# Patient Record
Sex: Female | Born: 1990 | Race: Black or African American | Hispanic: No | Marital: Married | Smoking: Former smoker
Health system: Southern US, Community
[De-identification: ages and names within clinical notes are randomized; demographics above are authoritative.]

## PROBLEM LIST (undated history)

## (undated) ENCOUNTER — Inpatient Hospital Stay (HOSPITAL_COMMUNITY): Payer: Medicaid Other

## (undated) ENCOUNTER — Inpatient Hospital Stay (HOSPITAL_COMMUNITY): Payer: Self-pay

## (undated) DIAGNOSIS — R748 Abnormal levels of other serum enzymes: Secondary | ICD-10-CM

## (undated) DIAGNOSIS — K219 Gastro-esophageal reflux disease without esophagitis: Secondary | ICD-10-CM

## (undated) HISTORY — PX: INDUCED ABORTION: SHX677

## (undated) HISTORY — PX: NO PAST SURGERIES: SHX2092

---

## 2014-02-20 ENCOUNTER — Encounter (HOSPITAL_COMMUNITY): Payer: Self-pay | Admitting: Emergency Medicine

## 2014-02-20 ENCOUNTER — Emergency Department (HOSPITAL_COMMUNITY)
Admission: EM | Admit: 2014-02-20 | Discharge: 2014-02-20 | Disposition: A | Discharge status: Home / Self Care | Payer: BC Managed Care – PPO | Source: Home / Self Care | Attending: Family Medicine | Admitting: Family Medicine

## 2014-02-20 DIAGNOSIS — Z3201 Encounter for pregnancy test, result positive: Secondary | ICD-10-CM

## 2014-02-20 DIAGNOSIS — Z3490 Encounter for supervision of normal pregnancy, unspecified, unspecified trimester: Secondary | ICD-10-CM

## 2014-02-20 LAB — POCT PREGNANCY, URINE: Preg Test, Ur: POSITIVE — AB

## 2014-02-20 NOTE — ED Provider Notes (Signed)
CSN: [id number]     Arrival date & time 02/20/14  1921 History   First MD Initiated Contact with Patient 02/20/14 2031     Chief Complaint  Patient presents with  . Possible Pregnancy   (Consider location/radiation/quality/duration/timing/severity/associated sxs/prior Treatment) HPI Comments: States she took a home pregnancy test yesterday that was positive and she come here for confirmation so that she may [facility].  G2P1 LNMP: 01-23-2014 No abdominal pain, vaginal bleeding or vaginal discharge  Patient is a 23 y.o. female presenting with pregnancy problem. The history is provided by the patient.  Possible Pregnancy    History reviewed. No pertinent past medical history. No past surgical history on file. No family history on file. History  Substance Use Topics  . Smoking status: Not on file  . Smokeless tobacco: Not on file  . Alcohol Use: Not on file   OB History   Grav Para Term Preterm Abortions TAB SAB Ect Mult Living                 Review of Systems  Constitutional: Negative.   HENT: Negative.   Eyes: Negative.   Respiratory: Negative.   Cardiovascular: Negative.   Gastrointestinal: Negative.   Endocrine: Negative for polydipsia, polyphagia and polyuria.  Genitourinary: Negative.   Musculoskeletal: Negative.   Skin: Negative.     Allergies  Review of patient's allergies indicates no known allergies.  Home Medications   Prior to Admission medications   Not on File   BP 103/60  Pulse 77  Temp(Src) 98.8 F (37.1 C) (Oral)  Resp 16  SpO2 96% Physical Exam  Nursing note and vitals reviewed. Constitutional: She is oriented to person, place, and time. She appears well-developed and well-nourished. No distress.  HENT:  Head: Normocephalic and atraumatic.  Eyes: Conjunctivae are normal.  Cardiovascular: Normal rate, regular rhythm and normal heart sounds.   Pulmonary/Chest: Effort normal and breath sounds normal.   Abdominal: Soft. Bowel sounds are normal. She exhibits no distension. There is no tenderness.  Musculoskeletal: Normal range of motion.  Neurological: She is alert and oriented to person, place, and time.  Skin: Skin is warm and dry.  Psychiatric: She has a normal mood and affect. Her behavior is normal.    ED Course  Procedures (including critical care time) Labs Review Labs Reviewed  POCT PREGNANCY, URINE - Abnormal; Notable for the following:    Preg Test, Ur POSITIVE (*)    All other components within normal limits    Imaging Review No results found.   MDM   1. Pregnancy    Advised to begin daily prenatal vitamins and [facility].    Jess BartersJennifer Lee PalmerPresson, [place]PA 02/20/14 2052

## 2014-02-20 NOTE — ED Notes (Addendum)
Here for pregnancy test to confirm for doctors appt  States she took a home test which was positive

## 2014-02-20 NOTE — Discharge Instructions (Signed)

## 2014-02-21 NOTE — ED Provider Notes (Signed)
Medical screening examination/treatment/procedure(s) were performed by resident physician or non-physician practitioner and as supervising physician I was immediately available for consultation/collaboration.   Rachael Zapanta DOUGLAS MD.   Latrisha Coiro D Laporshia Hogen, MD 02/21/14 1010 

## 2014-03-02 ENCOUNTER — Emergency Department (HOSPITAL_COMMUNITY)
Admission: EM | Admit: 2014-03-02 | Discharge: 2014-03-02 | Disposition: A | Discharge status: Home / Self Care | Payer: BC Managed Care – PPO | Attending: Emergency Medicine | Admitting: Emergency Medicine

## 2014-03-02 ENCOUNTER — Encounter (HOSPITAL_COMMUNITY): Payer: Self-pay | Admitting: Emergency Medicine

## 2014-03-02 DIAGNOSIS — O99891 Other specified diseases and conditions complicating pregnancy: Secondary | ICD-10-CM | POA: Insufficient documentation

## 2014-03-02 DIAGNOSIS — R8271 Bacteriuria: Secondary | ICD-10-CM

## 2014-03-02 DIAGNOSIS — O98819 Other maternal infectious and parasitic diseases complicating pregnancy, unspecified trimester: Secondary | ICD-10-CM | POA: Insufficient documentation

## 2014-03-02 DIAGNOSIS — E876 Hypokalemia: Secondary | ICD-10-CM | POA: Insufficient documentation

## 2014-03-02 DIAGNOSIS — O2341 Unspecified infection of urinary tract in pregnancy, first trimester: Secondary | ICD-10-CM

## 2014-03-02 DIAGNOSIS — A599 Trichomoniasis, unspecified: Secondary | ICD-10-CM

## 2014-03-02 DIAGNOSIS — N390 Urinary tract infection, site not specified: Secondary | ICD-10-CM

## 2014-03-02 DIAGNOSIS — O9989 Other specified diseases and conditions complicating pregnancy, childbirth and the puerperium: Secondary | ICD-10-CM

## 2014-03-02 DIAGNOSIS — O218 Other vomiting complicating pregnancy: Secondary | ICD-10-CM | POA: Insufficient documentation

## 2014-03-02 DIAGNOSIS — O219 Vomiting of pregnancy, unspecified: Secondary | ICD-10-CM

## 2014-03-02 DIAGNOSIS — A5901 Trichomonal vulvovaginitis: Secondary | ICD-10-CM | POA: Insufficient documentation

## 2014-03-02 LAB — CBC
HCT: 39.1 % (ref 36.0–46.0)
Hemoglobin: 13.7 g/dL (ref 12.0–15.0)
MCH: 29.1 pg (ref 26.0–34.0)
MCHC: 35 g/dL (ref 30.0–36.0)
MCV: 83 fL (ref 78.0–100.0)
Platelets: 307 10*3/uL (ref 150–400)
RBC: 4.71 MIL/uL (ref 3.87–5.11)
RDW: 13 % (ref 11.5–15.5)
WBC: 10.1 10*3/uL (ref 4.0–10.5)

## 2014-03-02 LAB — I-STAT CHEM 8, ED
BUN: 7 mg/dL (ref 6–23)
CHLORIDE: 102 meq/L (ref 96–112)
CREATININE: 0.8 mg/dL (ref 0.50–1.10)
Calcium, Ion: 1.11 mmol/L — ABNORMAL LOW (ref 1.12–1.23)
GLUCOSE: 81 mg/dL (ref 70–99)
HEMATOCRIT: 46 % (ref 36.0–46.0)
Hemoglobin: 15.6 g/dL — ABNORMAL HIGH (ref 12.0–15.0)
Potassium: 3.1 mEq/L — ABNORMAL LOW (ref 3.7–5.3)
SODIUM: 137 meq/L (ref 137–147)
TCO2: 23 mmol/L (ref 0–100)

## 2014-03-02 LAB — URINE MICROSCOPIC-ADD ON

## 2014-03-02 LAB — URINALYSIS, ROUTINE W REFLEX MICROSCOPIC
Glucose, UA: NEGATIVE mg/dL
Hgb urine dipstick: NEGATIVE
Ketones, ur: >80 mg/dL — AB
Nitrite: NEGATIVE
PH: 6 (ref 5.0–8.0)
Protein, ur: 30 mg/dL — AB
Specific Gravity, Urine: 1.037 — ABNORMAL HIGH (ref 1.005–1.030)
UROBILINOGEN UA: 1 mg/dL (ref 0.0–1.0)

## 2014-03-02 LAB — HCG, QUANTITATIVE, PREGNANCY: HCG, BETA CHAIN, QUANT, S: 45640 m[IU]/mL — AB (ref <5)

## 2014-03-02 LAB — WET PREP, GENITAL
Clue Cells Wet Prep HPF POC: NONE SEEN
Yeast Wet Prep HPF POC: NONE SEEN

## 2014-03-02 MED ORDER — SODIUM CHLORIDE 0.9 % IV BOLUS (SEPSIS)
2000.0000 mL | Freq: Once | INTRAVENOUS | Status: AC
  Administered 2014-03-02: 2000 mL via INTRAVENOUS

## 2014-03-02 MED ORDER — NITROFURANTOIN MONOHYD MACRO 100 MG PO CAPS: 100.0000 mg | ORAL_CAPSULE | Freq: Two times a day (BID) | ORAL | Status: DC

## 2014-03-02 MED ORDER — POTASSIUM CHLORIDE CRYS ER 20 MEQ PO TBCR
40.0000 meq | EXTENDED_RELEASE_TABLET | Freq: Once | ORAL | Status: AC
  Administered 2014-03-02: 40 meq via ORAL
  Filled 2014-03-02: qty 2

## 2014-03-02 MED ORDER — GUAIFENESIN ER 600 MG PO TB12
600.0000 mg | ORAL_TABLET | Freq: Two times a day (BID) | ORAL | Status: DC
  Administered 2014-03-02: 600 mg via ORAL
  Filled 2014-03-02: qty 1

## 2014-03-02 MED ORDER — ONDANSETRON HCL 4 MG/2ML IJ SOLN: 4.0000 mg | Freq: Once | INTRAMUSCULAR | Status: DC

## 2014-03-02 MED ORDER — METOCLOPRAMIDE HCL 5 MG/ML IJ SOLN
10.0000 mg | Freq: Once | INTRAMUSCULAR | Status: AC
  Administered 2014-03-02: 10 mg via INTRAVENOUS
  Filled 2014-03-02: qty 2

## 2014-03-02 MED ORDER — ONDANSETRON HCL 4 MG PO TABS: 4.0000 mg | ORAL_TABLET | Freq: Four times a day (QID) | ORAL | Status: DC

## 2014-03-02 MED ORDER — METOCLOPRAMIDE HCL 5 MG/ML IJ SOLN: 10.0000 mg | Freq: Once | INTRAMUSCULAR | Status: DC

## 2014-03-02 MED ORDER — ONDANSETRON HCL 4 MG/2ML IJ SOLN
4.0000 mg | Freq: Once | INTRAMUSCULAR | Status: AC
  Administered 2014-03-02: 4 mg via INTRAVENOUS
  Filled 2014-03-02: qty 2

## 2014-03-02 NOTE — ED Provider Notes (Signed)
2255 - Patient care assumed from Dr. Ethelda ChickJacubowitz at shift change. Patient presents for persistent vomiting during pregnancy. Patient dosed with Zofran at 2130. Plan discussed with Dr. Ethelda ChickJacubowitz which includes discharge if patient able to tolerate fluids and has no persistent emesis. Otherwise, [facility].  Patient monitored after dose of Zofran. She states that she no longer has any nausea. Patient has been able to drink a full cup of water without emesis. Heart rate improved from arrival after 2 L IV fluids. Patient states that she feels comfortable managing her symptoms further at home. Will discharge with prescription for Macrobid for asymptomatic bacteriuria and Zofran for nausea/vomiting. Will hold treatment for trichomoniasis as Dr. Gaynell FaceMarshall of OB/GYN recommends treatment in second trimester. Return precautions discussed and provided. Patient agreeable to plan with no unaddressed concerns.   Filed Vitals:   03/02/14 2145 03/02/14 2200 03/02/14 2230 03/02/14 2245  BP: 111/69 103/63 105/64 108/64  Pulse: 88 71 63 70  Temp:      TempSrc:      Resp: 19 14 16 16   Height:      Weight:      SpO2: 99% 100% 100% 100%     Antony MaduraKelly Humes, PA-C 03/02/14 2258

## 2014-03-02 NOTE — ED Notes (Signed)
Pt is pregnant but unsure how far. Has been vomiting for about a week and a half. Trying to get appt set up for OB. Abd pain comes and goes. Vaginal discharge clear and greenish sometimes.

## 2014-03-02 NOTE — ED Notes (Signed)
Patient has consumed whole cup of water, 

## 2014-03-02 NOTE — ED Notes (Signed)
Reported vomiting to Dr. Ethelda ChickJacubowitz.  MD orders zofran for management.

## 2014-03-02 NOTE — ED Notes (Signed)
Tresa EndoKelly, PA-C at the bedside.

## 2014-03-02 NOTE — Discharge Instructions (Signed)
Be sure to drink plenty of fluids throughout the day to prevent dehydration. Takes Zofran as prescribed for nausea and vomiting. Take Macrobid as prescribed as your urine showed signs of infection. Followup with your primary care doctor/OBGYN. Return to the emergency department if symptoms worsen and if your vomiting is not controlled with Zofran. Also recommended you refrain from taking prenatal vitamins until speaking with your Obstetrician as it may make your vomiting worse.  Hyperemesis Gravidarum Hyperemesis gravidarum is a severe form of nausea and vomiting that happens during pregnancy. Hyperemesis is worse than morning sickness. It may cause you to have nausea or vomiting all day for many days. It may keep you from eating and drinking enough food and liquids. Hyperemesis usually occurs during the first half (the first 20 weeks) of pregnancy. It often goes away once a woman is in her second half of pregnancy. However, sometimes hyperemesis continues through an entire pregnancy.  CAUSES  The cause of this condition is not completely known but is thought to be related to changes in the body's hormones when pregnant. It could be from the high level of the pregnancy hormone or an increase in estrogen in the body.  SIGNS AND SYMPTOMS   Severe nausea and vomiting.  Nausea that does not go away.  Vomiting that does not allow you to keep any food down.  Weight loss and body fluid loss (dehydration).  Having no desire to eat or not liking food you have previously enjoyed. DIAGNOSIS  Your health care provider will do a physical exam and ask you about your symptoms. He or she may also order blood tests and urine tests to make sure something else is not causing the problem.  TREATMENT  You may only need medicine to control the problem. If medicines do not control the nausea and vomiting, you will be treated in the hospital to prevent dehydration, increased acid in the blood (acidosis), weight loss,  and changes in the electrolytes in your body that may harm the unborn baby (fetus). You may need IV fluids.  HOME CARE INSTRUCTIONS   Only take over-the-counter or prescription medicines as directed by your health care provider.  Try eating a couple of dry crackers or toast in the morning before getting out of bed.  Avoid foods and smells that upset your stomach.  Avoid fatty and spicy foods.  Eat 5-6 small meals a day.  Do not drink when eating meals. Drink between meals.  For snacks, eat high-protein foods, such as cheese.  Eat or suck on things that have ginger in them. Ginger helps nausea.  Avoid food preparation. The smell of food can spoil your appetite.  Avoid iron pills and iron in your multivitamins until after 3-4 months of being pregnant. However, consult with your health care provider before stopping any prescribed iron pills. SEEK MEDICAL CARE IF:   Your abdominal pain increases.  You have a severe headache.  You have vision problems.  You are losing weight. SEEK IMMEDIATE MEDICAL CARE IF:   You are unable to keep fluids down.  You vomit blood.  You have constant nausea and vomiting.  You have excessive weakness.  You have extreme thirst.  You have dizziness or fainting.  You have a fever or persistent symptoms for more than 2-3 days.  You have a fever and your symptoms suddenly get worse. MAKE SURE YOU:   Understand these instructions.  Will watch your condition.  Will get help right away if you are not  doing well or get worse. Document Released: 08/25/2005 Document Revised: 06/15/2013 Document Reviewed: 04/06/2013 [facility] Patient Information 2015 [employer] [place]. This information is not intended to replace advice given to you by your health care provider. Make sure you discuss any questions you have with your health care provider.

## 2014-03-02 NOTE — ED Notes (Signed)
Patient given water as PO challenge.

## 2014-03-02 NOTE — ED Notes (Signed)
Add on requisition sent for urine culture.

## 2014-03-02 NOTE — ED Notes (Signed)
Dr. Ethelda ChickJacubowitz at the bedside. Assisted with pelvic exam.

## 2014-03-02 NOTE — ED Provider Notes (Signed)
CSN: [id number]     Arrival date & time 03/02/14  1523 History   First MD Initiated Contact with Patient 03/02/14 1845     Chief Complaint  Patient presents with  . Emesis During Pregnancy  . Vaginal Discharge     (Consider location/radiation/quality/duration/timing/severity/associated sxs/prior Treatment) HPI Complains of multiple episodes of vomiting onset for prostate 1.5 weeks. Patient is currently pregnant. She does not know how far along however states that last normal menstrual period was mid May 2015. She denies abdominal pain states she occasionally gets "hunger pains "because she gets hungry , as she's been unable to eat or drink for the past 2-3 days. Denies fever denies urinary symptoms. Other associated symptoms include vaginal discharge. No abnormal bleeding. History reviewed. No pertinent past medical history. past medical history negative Past OB/GYN history gravida 3 para 1011, with one therapeutic abortion and one term delivery History reviewed. No pertinent past surgical history. No family history on file. History  Substance Use Topics  . Smoking status: Never Smoker   . Smokeless tobacco: Not on file  . Alcohol Use: No   OB History   Grav Para Term Preterm Abortions TAB SAB Ect Mult Living                 Review of Systems  Constitutional: Negative.   HENT: Negative.        Postnasal drip  Respiratory: Negative.   Cardiovascular: Negative.   Gastrointestinal: Positive for nausea and vomiting.  Genitourinary: Positive for vaginal discharge.       Pregnant  Musculoskeletal: Negative.   Skin: Negative.   Neurological: Negative.   Psychiatric/Behavioral: Negative.   All other systems reviewed and are negative.     Allergies  Review of patient's allergies indicates no known allergies.  Home Medications   Prior to Admission medications   Not on File   BP 118/67  Pulse 76  Temp(Src) 98.5 F (36.9 C) (Oral)  Resp 16  Ht 5\' 4"  (1.626 m)  Wt 167  lb (75.751 kg)  BMI 28.65 kg/m2  SpO2 100%  LMP 01/16/2014 Physical Exam  Nursing note and vitals reviewed. Constitutional: She appears well-developed and well-nourished.  HENT:  Head: Normocephalic and atraumatic.  Eyes: Conjunctivae are normal. Pupils are equal, round, and reactive to light.  Neck: Neck supple. No tracheal deviation present. No thyromegaly present.  Cardiovascular: Normal rate and regular rhythm.   No murmur heard. Pulmonary/Chest: Effort normal and breath sounds normal.  Abdominal: Soft. Bowel sounds are normal. She exhibits no distension. There is no tenderness.  Genitourinary:  No external lesion. Cervical os closed. Copious yellowish vaginal discharge. No cervical motion tenderness no adnexal mass or tenderness. Uterine fundus approximately 12 week size. No uterine tenderness  Musculoskeletal: Normal range of motion. She exhibits no edema and no tenderness.  Neurological: She is alert. Coordination normal.  Skin: Skin is warm and dry. No rash noted.  Psychiatric: She has a normal mood and affect.    ED Course  Procedures (including critical care time) Labs Review Labs Reviewed  HCG, QUANTITATIVE, PREGNANCY - Abnormal; Notable for the following:    hCG, Beta Chain, Quant, S [id number] (*)    All other components within normal limits  CBC    Imaging Review No results found.   EKG Interpretation None      Results for orders placed during the hospital encounter of 03/02/14  WET PREP, GENITAL      Result Value Ref Range   Yeast Wet  Prep HPF POC NONE SEEN  NONE SEEN   Trich, Wet Prep FEW (*) NONE SEEN   Clue Cells Wet Prep HPF POC NONE SEEN  NONE SEEN   WBC, Wet Prep HPF POC TOO NUMEROUS TO COUNT (*) NONE SEEN  HCG, QUANTITATIVE, PREGNANCY      Result Value Ref Range   hCG, Beta Chain, Quant, S 45640 (*) <5 mIU/mL  CBC      Result Value Ref Range   WBC 10.1  4.0 - 10.5 K/uL   RBC 4.71  3.87 - 5.11 MIL/uL   Hemoglobin 13.7  12.0 - 15.0 g/dL   HCT 16.139.1   09.636.0 - 04.546.0 %   MCV 83.0  78.0 - 100.0 fL   MCH 29.1  26.0 - 34.0 pg   MCHC 35.0  30.0 - 36.0 g/dL   RDW 40.913.0  81.111.5 - 91.415.5 %   Platelets 307  150 - 400 K/uL  URINALYSIS, ROUTINE W REFLEX MICROSCOPIC      Result Value Ref Range   Color, Urine Jaretzi (*) YELLOW   APPearance CLEAR  CLEAR   Specific Gravity, Urine 1.037 (*) 1.005 - 1.030   pH 6.0  5.0 - 8.0   Glucose, UA NEGATIVE  NEGATIVE mg/dL   Hgb urine dipstick NEGATIVE  NEGATIVE   Bilirubin Urine SMALL (*) NEGATIVE   Ketones, ur >80 (*) NEGATIVE mg/dL   Protein, ur 30 (*) NEGATIVE mg/dL   Urobilinogen, UA 1.0  0.0 - 1.0 mg/dL   Nitrite NEGATIVE  NEGATIVE   Leukocytes, UA SMALL (*) NEGATIVE  URINE MICROSCOPIC-ADD ON      Result Value Ref Range   Squamous Epithelial / LPF FEW (*) RARE   WBC, UA 3-6  <3 WBC/hpf   Bacteria, UA FEW (*) RARE   Urine-Other MUCOUS PRESENT    I-STAT CHEM 8, ED      Result Value Ref Range   Sodium 137  137 - 147 mEq/L   Potassium 3.1 (*) 3.7 - 5.3 mEq/L   Chloride 102  96 - 112 mEq/L   BUN 7  6 - 23 mg/dL   Creatinine, Ser 7.820.80  0.50 - 1.10 mg/dL   Glucose, Bld 81  70 - 99 mg/dL   Calcium, Ion 9.561.11 (*) 1.12 - 1.23 mmol/L   TCO2 23  0 - 100 mmol/L   Hemoglobin 15.6 (*) 12.0 - 15.0 g/dL   HCT 21.346.0  08.636.0 - 57.846.0 %   No results found.  9:25 PM patient continued to vomit after treatment with intravenous Reglan and intravenous fluids. Zofran ordered. MDM  I spoke with Dr. Gaynell FaceMarshall. [facility] If she continues to vomit after additional antiemetics and intravenous hydration she will work on her hospitalization. If she is a we'll hold down fluids. She can go home with prescription for Zofran, Macrobid. We will hold off on prenatal vitamins presently as they can contribute to nausea and vomiting. We will not treat trichomonas until [redacted] weeks pregnant. Urine sent for culture. Pt signed out to Ms. Humes at 935 pm who will re-evaluate pt Diagnosis #1 emesis gravidarum #2 dehydration #3  bacteriuria #4 hypokalemia  #5trichamoniasis Final diagnoses:  None        Doug SouSam Jacubowitz, MD 03/02/14 2137

## 2014-03-03 LAB — URINE CULTURE
COLONY COUNT: NO GROWTH
CULTURE: NO GROWTH
Special Requests: NORMAL

## 2014-03-03 LAB — GC/CHLAMYDIA PROBE AMP
CT Probe RNA: NEGATIVE
GC PROBE AMP APTIMA: NEGATIVE

## 2014-03-03 LAB — RPR

## 2014-03-03 LAB — HIV ANTIBODY (ROUTINE TESTING W REFLEX): HIV 1&2 Ab, 4th Generation: NONREACTIVE

## 2014-03-03 NOTE — ED Provider Notes (Signed)
Medical screening examination/treatment/procedure(s) were performed by non-physician practitioner and as supervising physician I was immediately available for consultation/collaboration.   EKG Interpretation None       Corrado Hymon R. Gwyn Hieronymus, MD 03/03/14 0023 

## 2014-04-03 ENCOUNTER — Encounter: Payer: Self-pay | Admitting: Obstetrics & Gynecology

## 2014-09-04 ENCOUNTER — Encounter: Payer: Self-pay | Admitting: *Deleted

## 2014-09-05 ENCOUNTER — Encounter: Payer: Self-pay | Admitting: Obstetrics & Gynecology

## 2014-11-17 ENCOUNTER — Encounter (HOSPITAL_COMMUNITY): Payer: Self-pay | Admitting: Emergency Medicine

## 2014-11-17 ENCOUNTER — Emergency Department (HOSPITAL_COMMUNITY)
Admission: EM | Admit: 2014-11-17 | Discharge: 2014-11-17 | Disposition: A | Discharge status: Home / Self Care | Payer: 59 | Source: Home / Self Care | Attending: Emergency Medicine | Admitting: Emergency Medicine

## 2014-11-17 DIAGNOSIS — R42 Dizziness and giddiness: Secondary | ICD-10-CM | POA: Diagnosis not present

## 2014-11-17 DIAGNOSIS — G4489 Other headache syndrome: Secondary | ICD-10-CM

## 2014-11-17 LAB — POCT I-STAT, CHEM 8
BUN: 11 mg/dL (ref 6–23)
CHLORIDE: 105 mmol/L (ref 96–112)
CREATININE: 0.8 mg/dL (ref 0.50–1.10)
Calcium, Ion: 1.21 mmol/L (ref 1.12–1.23)
Glucose, Bld: 94 mg/dL (ref 70–99)
HCT: 43 % (ref 36.0–46.0)
Hemoglobin: 14.6 g/dL (ref 12.0–15.0)
Potassium: 4.1 mmol/L (ref 3.5–5.1)
Sodium: 139 mmol/L (ref 135–145)
TCO2: 20 mmol/L (ref 0–100)

## 2014-11-17 LAB — POCT URINALYSIS DIP (DEVICE)
BILIRUBIN URINE: NEGATIVE
GLUCOSE, UA: NEGATIVE mg/dL
Hgb urine dipstick: NEGATIVE
Ketones, ur: NEGATIVE mg/dL
Leukocytes, UA: NEGATIVE
Nitrite: NEGATIVE
Protein, ur: 30 mg/dL — AB
SPECIFIC GRAVITY, URINE: 1.025 (ref 1.005–1.030)
Urobilinogen, UA: 1 mg/dL (ref 0.0–1.0)
pH: 7 (ref 5.0–8.0)

## 2014-11-17 LAB — POCT PREGNANCY, URINE: PREG TEST UR: NEGATIVE

## 2014-11-17 MED ORDER — MECLIZINE HCL 25 MG PO TABS: 25.0000 mg | ORAL_TABLET | Freq: Four times a day (QID) | ORAL | Status: DC | PRN

## 2014-11-17 MED ORDER — BUTALBITAL-APAP-CAFFEINE 50-325-40 MG PO TABS: 1.0000 | ORAL_TABLET | Freq: Four times a day (QID) | ORAL | Status: DC | PRN

## 2014-11-17 MED ORDER — PREDNISONE 10 MG PO TABS: ORAL_TABLET | ORAL | Status: DC

## 2014-11-17 NOTE — ED Provider Notes (Signed)
CSN: [id number]     Arrival date & time 11/17/14  1924 History   First MD Initiated Contact with Patient 11/17/14 2020     Chief Complaint  Patient presents with  . Dizziness  . Headache   (Consider location/radiation/quality/duration/timing/severity/associated sxs/prior Treatment) HPI        24 year old female presents for evaluation of dizziness, vertigo, headache. This started about 4-5 days ago. It has been intermittent. She has taken over-the-counter medications for headache but they don't seem to be helping. The headache is in a band across her forehead. She also has some nasal congestion. She denies chest pain, shortness of breath, ear pain. No recent travel or sick contacts. No history of DVT or PE. She takes no medications and has no past medical history. She is sexually active admits to possibility of pregnancy. No abdominal pain or vaginal discharge or bleeding. She has no history of heavy periods and no history of anemia.  History reviewed. No pertinent past medical history. History reviewed. No pertinent past surgical history. History reviewed. No pertinent family history. History  Substance Use Topics  . Smoking status: Never Smoker   . Smokeless tobacco: Not on file  . Alcohol Use: No   OB History    No data available     Review of Systems  Constitutional: Negative for fever and chills.  HENT: Positive for congestion. Negative for sinus pressure.   Neurological: Positive for dizziness, light-headedness and headaches.  All other systems reviewed and are negative.   Allergies  Review of patient's allergies indicates no known allergies.  Home Medications   Prior to Admission medications   Medication Sig Start Date End Date Taking? Authorizing Provider  butalbital-acetaminophen-caffeine (FIORICET) 50-325-40 MG per tablet Take 1-2 tablets by mouth every 6 (six) hours as needed for headache. 11/17/14 11/17/15  Graylon Good, PA-C  meclizine (ANTIVERT) 25 MG tablet Take  1 tablet (25 mg total) by mouth every 6 (six) hours as needed for dizziness. 11/17/14   Graylon Good, PA-C  nitrofurantoin, macrocrystal-monohydrate, (MACROBID) 100 MG capsule Take 1 capsule (100 mg total) by mouth 2 (two) times daily. 03/02/14   Antony Madura, PA-C  ondansetron (ZOFRAN) 4 MG tablet Take 1 tablet (4 mg total) by mouth every 6 (six) hours. 03/02/14   Antony Madura, PA-C  predniSONE (DELTASONE) 10 MG tablet 4 tabs PO QD for 4 days; 3 tabs PO QD for 3 days; 2 tabs PO QD for 2 days; 1 tab PO QD for 1 day 11/17/14   Graylon Good, PA-C   BP 106/73 mmHg  Pulse 68  Temp(Src) 98.2 F (36.8 C) (Oral)  Resp 16  SpO2 100% Physical Exam  Constitutional: She is oriented to person, place, and time. Vital signs are normal. She appears well-developed and well-nourished. No distress.  HENT:  Head: Normocephalic and atraumatic.  Right Ear: Tympanic membrane, external ear and ear canal normal.  Left Ear: Tympanic membrane, external ear and ear canal normal.  Nose: Nose normal.  Mouth/Throat: Oropharynx is clear and moist. No oropharyngeal exudate.  Pulmonary/Chest: Effort normal. No respiratory distress.  Neurological: She is alert and oriented to person, place, and time. She has normal strength and normal reflexes. No cranial nerve deficit or sensory deficit. She exhibits normal muscle tone. She displays a negative Romberg sign. Coordination and gait normal. GCS eye subscore is 4. GCS verbal subscore is 5. GCS motor subscore is 6.  Normal walking and heel-toe gait. Heel shin is normal. Finger to nose normal.  Rapid alternating hand movements is normal.  Skin: Skin is warm and dry. No rash noted. She is not diaphoretic.  Psychiatric: She has a normal mood and affect. Judgment normal.  Nursing note and vitals reviewed.   ED Course  ED EKG  Date/Time: 11/17/2014 9:25 PM Performed by: Graylon GoodBAKER, Aleph Nickson H Authorized by: Autumn MessingBAKER, Manjot Beumer H Comparison: not compared with previous ECG  Rhythm: sinus  rhythm Rate: normal QRS axis: normal Conduction: conduction normal ST Segments: ST segments normal T Waves: T waves normal Other: no other findings Clinical impression: normal ECG Comments: Normal EKG, agree with automatic read   (including critical care time) Labs Review Labs Reviewed  POCT URINALYSIS DIP (DEVICE) - Abnormal; Notable for the following:    Protein, ur 30 (*)    All other components within normal limits  POCT PREGNANCY, URINE  POCT I-STAT, CHEM 8    Imaging Review No results found.   MDM   1. Dizziness   2. Other headache syndrome    Detailed physical exam is completely normal. Her workup here today is normal. Includes a normal EKG, normal urinalysis, normal i-STAT chem 8, and a negative urine pregnancy test. We will treat her headache, short course of prednisone to help with any sinus pressure, also meclizine for vertigo. She will follow-up if she is not improving significantly in a few days for recheck  Meds ordered this encounter  Medications  . predniSONE (DELTASONE) 10 MG tablet    Sig: 4 tabs PO QD for 4 days; 3 tabs PO QD for 3 days; 2 tabs PO QD for 2 days; 1 tab PO QD for 1 day    Dispense:  30 tablet    Refill:  0  . butalbital-acetaminophen-caffeine (FIORICET) 50-325-40 MG per tablet    Sig: Take 1-2 tablets by mouth every 6 (six) hours as needed for headache.    Dispense:  20 tablet    Refill:  0  . meclizine (ANTIVERT) 25 MG tablet    Sig: Take 1 tablet (25 mg total) by mouth every 6 (six) hours as needed for dizziness.    Dispense:  12 tablet    Refill:  0     Graylon GoodZachary H Gervis Gaba, PA-C 11/17/14 2126

## 2014-11-17 NOTE — Discharge Instructions (Signed)

## 2015-02-24 ENCOUNTER — Encounter (HOSPITAL_COMMUNITY): Payer: Self-pay | Admitting: *Deleted

## 2015-02-24 ENCOUNTER — Inpatient Hospital Stay (HOSPITAL_COMMUNITY)
Admission: AD | Admit: 2015-02-24 | Discharge: 2015-02-24 | Disposition: A | Discharge status: Home / Self Care | Payer: Medicaid Other | Source: Ambulatory Visit | Attending: Obstetrics & Gynecology | Admitting: Obstetrics & Gynecology

## 2015-02-24 DIAGNOSIS — O210 Mild hyperemesis gravidarum: Secondary | ICD-10-CM | POA: Diagnosis present

## 2015-02-24 DIAGNOSIS — O99611 Diseases of the digestive system complicating pregnancy, first trimester: Secondary | ICD-10-CM | POA: Diagnosis not present

## 2015-02-24 DIAGNOSIS — O219 Vomiting of pregnancy, unspecified: Secondary | ICD-10-CM

## 2015-02-24 DIAGNOSIS — K117 Disturbances of salivary secretion: Secondary | ICD-10-CM

## 2015-02-24 DIAGNOSIS — O211 Hyperemesis gravidarum with metabolic disturbance: Secondary | ICD-10-CM | POA: Insufficient documentation

## 2015-02-24 DIAGNOSIS — Z3A01 Less than 8 weeks gestation of pregnancy: Secondary | ICD-10-CM | POA: Insufficient documentation

## 2015-02-24 MED ORDER — PROMETHAZINE HCL 25 MG PO TABS: 25.0000 mg | ORAL_TABLET | Freq: Four times a day (QID) | ORAL | Status: DC | PRN

## 2015-02-24 MED ORDER — GLYCOPYRROLATE 2 MG PO TABS: 2.0000 mg | ORAL_TABLET | Freq: Three times a day (TID) | ORAL | Status: DC

## 2015-02-24 MED ORDER — PROMETHAZINE HCL 25 MG/ML IJ SOLN
25.0000 mg | Freq: Once | INTRAMUSCULAR | Status: AC
  Administered 2015-02-24: 25 mg via INTRAVENOUS
  Filled 2015-02-24: qty 1

## 2015-02-24 MED ORDER — LACTATED RINGERS IV SOLN: Freq: Once | INTRAVENOUS | Status: DC

## 2015-02-24 NOTE — MAU Provider Note (Signed)
History     CSN: [id number]  Arrival date and time: 02/24/15 1137   First Provider Initiated Contact with Patient 02/24/15 1218      Chief Complaint  Patient presents with  . Nausea  . Emesis   HPI Kristen Matthews 23 y.o. [redacted]w[redacted]d  Has been having spitting and vomiting during this pregnancy.  [facility].  States she has not been able to keep down food or fluids.  Vomited last on the way to the hospital.  Also has problems with mucus and spitting in addition to the vomiting.  Spitting repetitively while being interviewed.  Unable to give but a few drops of urine - lab not able to run a urinalysis due to the decreased quantity.  OB History    Gravida Para Term Preterm AB TAB SAB Ectopic Multiple Living   History reviewed. No pertinent past medical history.  Past Surgical History  Procedure Laterality Date  . Induced abortion      Family History  Problem Relation Age of Onset  . Diabetes Maternal Aunt   . Diabetes Maternal Uncle   . Heart disease Maternal Uncle   . Hypertension Maternal Grandfather   . Diabetes Maternal Grandfather   . Heart disease Maternal Grandfather   . COPD Maternal Grandfather   . Diabetes Paternal Grandmother     History  Substance Use Topics  . Smoking status: Never Smoker   . Smokeless tobacco: Not on file  . Alcohol Use: No    Allergies: No Known Allergies  Prescriptions prior to admission  Medication Sig Dispense Refill Last Dose  . butalbital-acetaminophen-caffeine (FIORICET) 50-325-40 MG per tablet Take 1-2 tablets by mouth every 6 (six) hours as needed for headache. 20 tablet 0   . meclizine (ANTIVERT) 25 MG tablet Take 1 tablet (25 mg total) by mouth every 6 (six) hours as needed for dizziness. 12 tablet 0   . nitrofurantoin, macrocrystal-monohydrate, (MACROBID) 100 MG capsule Take 1 capsule (100 mg total) by mouth 2 (two) times daily. 10 capsule 0   .  ondansetron (ZOFRAN) 4 MG tablet Take 1 tablet (4 mg total) by mouth every 6 (six) hours. 12 tablet 1   . predniSONE (DELTASONE) 10 MG tablet 4 tabs PO QD for 4 days; 3 tabs PO QD for 3 days; 2 tabs PO QD for 2 days; 1 tab PO QD for 1 day 30 tablet 0     Review of Systems  Constitutional: Negative for fever.  Gastrointestinal: Positive for nausea and vomiting. Negative for abdominal pain and diarrhea.  Genitourinary:       No vaginal discharge. No vaginal bleeding. No dysuria.   Physical Exam   Last menstrual period 01/12/2015.  Physical Exam  Nursing note and vitals reviewed. Constitutional: She is oriented to person, place, and time. She appears well-developed and well-nourished.  Sitting in bed and spitting white mucus repetitively.  Sitting in bed.  Talkative.  Makes good eye contact and is smiling.  HENT:  Head: Normocephalic.  Eyes: EOM are normal.  Neck: Neck supple.  Musculoskeletal: Normal range of motion.  Neurological: She is alert and oriented to person, place, and time.  Skin: Skin is warm and dry.  Psychiatric: She has a normal mood and affect.    MAU Course  Procedures  MDM Was able to sleep and rest with IVF with Phenergan.  Still not able to urinate so 1000cc LR hung to infuse. Was able to void after second bag of fluids.  Assessment and Plan  Vomiting in early pregnancy Mild dehydration Ptyalism  Plan Will eprescribe phenergan for vomiting and robinul for ptyalism Begin prenatal care as scheduled at the [facility] Eat small, frequent meals every 2-3 hours to keep some food in your stomach continuously to reduce the nausea. Drink at least 8 8-oz glasses of water every day. Take Tylenol 325 mg 2 tablets by mouth every 4 hours if needed for pain.   BURLESON,TERRI 02/24/2015, 12:26 PM

## 2015-02-24 NOTE — Discharge Instructions (Signed)
Get your medications from the pharmacy and take as needed. Drink at least 8 8-oz glasses of water every day. Take Tylenol 325 mg 2 tablets by mouth every 4 hours if needed for pain. No smoking, no drugs, no alcohol in pregnancy. Begin prenatal care as scheduled in July. Return if your symptoms worsen.

## 2015-02-24 NOTE — MAU Note (Addendum)
C/o N&V for past week; having some acid reflux today and spitting; has [facility]; does not have a rx for nausea;

## 2015-03-10 ENCOUNTER — Inpatient Hospital Stay (HOSPITAL_COMMUNITY)
Admission: AD | Admit: 2015-03-10 | Discharge: 2015-03-10 | Disposition: A | Discharge status: Home / Self Care | Payer: Medicaid Other | Source: Ambulatory Visit | Attending: Obstetrics & Gynecology | Admitting: Obstetrics & Gynecology

## 2015-03-10 ENCOUNTER — Encounter (HOSPITAL_COMMUNITY): Payer: Self-pay | Admitting: *Deleted

## 2015-03-10 DIAGNOSIS — Z3A01 Less than 8 weeks gestation of pregnancy: Secondary | ICD-10-CM | POA: Diagnosis not present

## 2015-03-10 DIAGNOSIS — O219 Vomiting of pregnancy, unspecified: Secondary | ICD-10-CM

## 2015-03-10 DIAGNOSIS — K5900 Constipation, unspecified: Secondary | ICD-10-CM | POA: Insufficient documentation

## 2015-03-10 DIAGNOSIS — O99611 Diseases of the digestive system complicating pregnancy, first trimester: Secondary | ICD-10-CM

## 2015-03-10 DIAGNOSIS — O210 Mild hyperemesis gravidarum: Secondary | ICD-10-CM | POA: Insufficient documentation

## 2015-03-10 LAB — URINE MICROSCOPIC-ADD ON

## 2015-03-10 LAB — URINALYSIS, ROUTINE W REFLEX MICROSCOPIC
GLUCOSE, UA: NEGATIVE mg/dL
Hgb urine dipstick: NEGATIVE
KETONES UR: 40 mg/dL — AB
Nitrite: NEGATIVE
PH: 6 (ref 5.0–8.0)
PROTEIN: 100 mg/dL — AB
Specific Gravity, Urine: >1.03 — ABNORMAL HIGH (ref 1.005–1.030)
UROBILINOGEN UA: 2 mg/dL — AB (ref 0.0–1.0)

## 2015-03-10 LAB — POCT PREGNANCY, URINE: Preg Test, Ur: POSITIVE — AB

## 2015-03-10 MED ORDER — PROMETHAZINE HCL 25 MG PO TABS: 25.0000 mg | ORAL_TABLET | Freq: Four times a day (QID) | ORAL | Status: DC | PRN

## 2015-03-10 MED ORDER — POLYETHYLENE GLYCOL 3350 17 GM/SCOOP PO POWD: 1.0000 | Freq: Once | ORAL | Status: DC

## 2015-03-10 NOTE — MAU Provider Note (Signed)
History     CSN: [id number]  Arrival date and time: 03/10/15 1446   First Provider Initiated Contact with Patient 03/10/15 1526      Chief Complaint  Patient presents with  . Nausea  . Emesis   HPI 24 y.o. [id number] at 9469w1d w/ n/v ongoing this pregnancy, symptoms have not worsened, controlled with phenergan, vomiting 2-3 times per day, but states she is able to keep food and drink down. She is now out of phenergan and needs a refill, called nurse line and they told her to come in to MAU for refill. Pt denies other concerns today, states she feels well. She does state that she has not had a bowel movement in about 2 weeks, is not taking anything for constipation and is not having any abdominal pain.   History reviewed. No pertinent past medical history.  Past Surgical History  Procedure Laterality Date  . Induced abortion      Family History  Problem Relation Age of Onset  . Diabetes Maternal Aunt   . Diabetes Maternal Uncle   . Heart disease Maternal Uncle   . Hypertension Maternal Grandfather   . Diabetes Maternal Grandfather   . Heart disease Maternal Grandfather   . COPD Maternal Grandfather   . Diabetes Paternal Grandmother     History  Substance Use Topics  . Smoking status: Never Smoker   . Smokeless tobacco: Not on file  . Alcohol Use: No    Allergies: No Known Allergies  Prescriptions prior to admission  Medication Sig Dispense Refill Last Dose  . glycopyrrolate (ROBINUL-FORTE) 2 MG tablet Take 1 tablet (2 mg total) by mouth 3 (three) times daily. 60 tablet 0 03/09/2015 at Unknown time  . [DISCONTINUED] promethazine (PHENERGAN) 25 MG tablet Take 1 tablet (25 mg total) by mouth every 6 (six) hours as needed for nausea or vomiting. 30 tablet 0 03/10/2015 at Unknown time    Review of Systems  Constitutional: Negative.  Negative for fever and chills.  Respiratory: Negative.   Cardiovascular: Negative.   Gastrointestinal: Positive for nausea, vomiting and  constipation. Negative for abdominal pain and diarrhea.  Genitourinary: Negative for dysuria, urgency, frequency, hematuria and flank pain.       Negative for vaginal bleeding, vaginal discharge  Musculoskeletal: Negative.   Neurological: Negative.   Psychiatric/Behavioral: Negative.    Physical Exam   Blood pressure 109/67, pulse 93, temperature 98.4 F (36.9 C), resp. rate 16, last menstrual period 01/12/2015.  Physical Exam  Nursing note and vitals reviewed. Constitutional: She is oriented to person, place, and time. She appears well-developed and well-nourished. No distress.  Cardiovascular: Normal rate.   Respiratory: Effort normal.  GI: Soft. She exhibits no distension. There is no tenderness.  Musculoskeletal: Normal range of motion.  Neurological: She is alert and oriented to person, place, and time.  Skin: Skin is warm and dry.  Psychiatric: She has a normal mood and affect.    MAU Course  Procedures  Results for orders placed or performed during the hospital encounter of 03/10/15 (from the past 24 hour(s))  Urinalysis, Routine w reflex microscopic (not at [facility])     Status: Abnormal   Collection Time: 03/10/15  3:05 PM  Result Value Ref Range   Color, Urine Kayann (A) YELLOW   APPearance CLEAR CLEAR   Specific Gravity, Urine >1.030 (H) 1.005 - 1.030   pH 6.0 5.0 - 8.0   Glucose, UA NEGATIVE NEGATIVE mg/dL   Hgb urine dipstick NEGATIVE NEGATIVE  Bilirubin Urine MODERATE (A) NEGATIVE   Ketones, ur 40 (A) NEGATIVE mg/dL   Protein, ur 161 (A) NEGATIVE mg/dL   Urobilinogen, UA 2.0 (H) 0.0 - 1.0 mg/dL   Nitrite NEGATIVE NEGATIVE   Leukocytes, UA SMALL (A) NEGATIVE  Urine microscopic-add on     Status: Abnormal   Collection Time: 03/10/15  3:05 PM  Result Value Ref Range   Squamous Epithelial / LPF MANY (A) RARE   WBC, UA 7-10 <3 WBC/hpf   Bacteria, UA RARE RARE   Urine-Other MUCOUS PRESENT   Pregnancy, urine POC     Status: Abnormal   Collection Time: 03/10/15   3:13 PM  Result Value Ref Range   Preg Test, Ur POSITIVE (A) NEGATIVE     Assessment and Plan   1. Nausea and vomiting in pregnancy prior to [redacted] weeks gestation   2. Constipation in pregnancy in first trimester   Refilled phenergan and advised to start Miralax, pt drinking water and eating mostly fruit, but states she is tolerating PO well, advised to increase variation in her diet, add some protein and see if she is able to tolerate. Pt is dehydrated today, but feeling and looking well, declines IV hydration, taking PO fluids while in MAU. Advised to return if unable to tolerate more solids or if symptoms worsen. Has prenatal visit scheduled at end of July at [facility].     Medication List    TAKE these medications        glycopyrrolate 2 MG tablet  Commonly known as:  ROBINUL-FORTE  Take 1 tablet (2 mg total) by mouth 3 (three) times daily.     polyethylene glycol powder powder  Commonly known as:  GLYCOLAX/MIRALAX  Take 255 g by mouth once.     promethazine 25 MG tablet  Commonly known as:  PHENERGAN  Take 1 tablet (25 mg total) by mouth every 6 (six) hours as needed for nausea or vomiting.        Follow-up Information    Follow up with [facility] [place].   Why:  as scheduled   Contact information:   [address] [place] [address] [phone]        Thedford Bunton 03/10/2015, 3:47 PM

## 2015-03-10 NOTE — MAU Note (Addendum)
Pt reports that she has one pill left of her phenergan and would like a refill. She called the nurse line and they told her to come to MAU. Reports constant Nausea  and vomiting 2-3 times in the last 24 hours.Pt took phenergan 0830 and had some relief. Pt states that she doesn't feel any worse than she normally does, and came for a refill.

## 2015-03-26 ENCOUNTER — Inpatient Hospital Stay (HOSPITAL_COMMUNITY)
Admission: AD | Admit: 2015-03-26 | Discharge: 2015-03-26 | Disposition: A | Discharge status: Home / Self Care | Payer: Medicaid Other | Source: Ambulatory Visit | Attending: Obstetrics and Gynecology | Admitting: Obstetrics and Gynecology

## 2015-03-26 ENCOUNTER — Encounter (HOSPITAL_COMMUNITY): Payer: Self-pay

## 2015-03-26 DIAGNOSIS — N3001 Acute cystitis with hematuria: Secondary | ICD-10-CM

## 2015-03-26 DIAGNOSIS — O219 Vomiting of pregnancy, unspecified: Secondary | ICD-10-CM | POA: Diagnosis not present

## 2015-03-26 DIAGNOSIS — O210 Mild hyperemesis gravidarum: Secondary | ICD-10-CM | POA: Insufficient documentation

## 2015-03-26 DIAGNOSIS — Z3A10 10 weeks gestation of pregnancy: Secondary | ICD-10-CM | POA: Diagnosis not present

## 2015-03-26 DIAGNOSIS — O2341 Unspecified infection of urinary tract in pregnancy, first trimester: Secondary | ICD-10-CM | POA: Insufficient documentation

## 2015-03-26 LAB — COMPREHENSIVE METABOLIC PANEL
ALBUMIN: 4 g/dL (ref 3.5–5.0)
ALK PHOS: 48 U/L (ref 38–126)
ALT: 37 U/L (ref 14–54)
AST: 36 U/L (ref 15–41)
Anion gap: 8 (ref 5–15)
BUN: 8 mg/dL (ref 6–20)
CHLORIDE: 104 mmol/L (ref 101–111)
CO2: 21 mmol/L — AB (ref 22–32)
Calcium: 9.8 mg/dL (ref 8.9–10.3)
Creatinine, Ser: 0.67 mg/dL (ref 0.44–1.00)
GFR calc Af Amer: >60 mL/min (ref >60)
GFR calc non Af Amer: >60 mL/min (ref >60)
GLUCOSE: 118 mg/dL — AB (ref 65–99)
POTASSIUM: 3.5 mmol/L (ref 3.5–5.1)
Sodium: 133 mmol/L — ABNORMAL LOW (ref 135–145)
Total Bilirubin: 1.3 mg/dL — ABNORMAL HIGH (ref 0.3–1.2)
Total Protein: 8 g/dL (ref 6.5–8.1)

## 2015-03-26 LAB — CBC WITH DIFFERENTIAL/PLATELET
Basophils Absolute: 0 10*3/uL (ref 0.0–0.1)
Basophils Relative: 0 % (ref 0–1)
EOS ABS: 0.1 10*3/uL (ref 0.0–0.7)
Eosinophils Relative: 1 % (ref 0–5)
HEMATOCRIT: 41.5 % (ref 36.0–46.0)
HEMOGLOBIN: 15.1 g/dL — AB (ref 12.0–15.0)
LYMPHS ABS: 1.7 10*3/uL (ref 0.7–4.0)
Lymphocytes Relative: 19 % (ref 12–46)
MCH: 28.9 pg (ref 26.0–34.0)
MCHC: 36.4 g/dL — ABNORMAL HIGH (ref 30.0–36.0)
MCV: 79.5 fL (ref 78.0–100.0)
MONO ABS: 0.8 10*3/uL (ref 0.1–1.0)
Monocytes Relative: 9 % (ref 3–12)
Neutro Abs: 6.4 10*3/uL (ref 1.7–7.7)
Neutrophils Relative %: 71 % (ref 43–77)
PLATELETS: 301 10*3/uL (ref 150–400)
RBC: 5.22 MIL/uL — ABNORMAL HIGH (ref 3.87–5.11)
RDW: 13.5 % (ref 11.5–15.5)
WBC: 8.9 10*3/uL (ref 4.0–10.5)

## 2015-03-26 LAB — URINALYSIS, ROUTINE W REFLEX MICROSCOPIC
Glucose, UA: NEGATIVE mg/dL
Hgb urine dipstick: NEGATIVE
KETONES UR: 40 mg/dL — AB
Leukocytes, UA: NEGATIVE
Nitrite: NEGATIVE
Protein, ur: 30 mg/dL — AB
Specific Gravity, Urine: >1.03 — ABNORMAL HIGH (ref 1.005–1.030)
Urobilinogen, UA: 2 mg/dL — ABNORMAL HIGH (ref 0.0–1.0)
pH: 6 (ref 5.0–8.0)

## 2015-03-26 LAB — URINE MICROSCOPIC-ADD ON

## 2015-03-26 MED ORDER — FAMOTIDINE IN NACL 20-0.9 MG/50ML-% IV SOLN
20.0000 mg | Freq: Once | INTRAVENOUS | Status: AC
  Administered 2015-03-26: 20 mg via INTRAVENOUS
  Filled 2015-03-26: qty 50

## 2015-03-26 MED ORDER — LACTATED RINGERS IV SOLN
25.0000 mg | Freq: Once | INTRAVENOUS | Status: AC
  Administered 2015-03-26: 25 mg via INTRAVENOUS
  Filled 2015-03-26: qty 1

## 2015-03-26 MED ORDER — FAMOTIDINE 20 MG PO TABS: 20.0000 mg | ORAL_TABLET | Freq: Two times a day (BID) | ORAL | Status: DC

## 2015-03-26 MED ORDER — DOCUSATE SODIUM 100 MG PO CAPS: 100.0000 mg | ORAL_CAPSULE | Freq: Two times a day (BID) | ORAL | Status: DC

## 2015-03-26 MED ORDER — CEPHALEXIN 500 MG PO CAPS: 500.0000 mg | ORAL_CAPSULE | Freq: Four times a day (QID) | ORAL | Status: DC

## 2015-03-26 NOTE — Discharge Instructions (Signed)
Eating Plan for Hyperemesis Gravidarum °Severe cases of hyperemesis gravidarum can lead to dehydration and malnutrition. The hyperemesis eating plan is one way to lessen the symptoms of nausea and vomiting. It is often used with prescribed medicines to control your symptoms.  °WHAT CAN I DO TO RELIEVE MY SYMPTOMS? °Listen to your body. Everyone is different and has different preferences. Find what works best for you. Some of the following things may help: °· Eat and drink slowly. °· Eat 5-6 small meals daily instead of 3 large meals.   °· Eat crackers before you get out of bed in the morning.   °· Starchy foods are usually well tolerated (such as cereal, toast, bread, potatoes, pasta, rice, and pretzels).   °· Ginger may help with nausea. Add ¼ tsp ground ginger to hot tea or choose ginger tea.   °· Try drinking 100% fruit juice or an electrolyte drink. °· Continue to take your prenatal vitamins as directed by your health care provider. If you are having trouble taking your prenatal vitamins, talk with your health care provider about different options. °· Include at least 1 serving of protein with your meals and snacks (such as meats or poultry, beans, nuts, eggs, or yogurt). Try eating a protein-rich snack before bed (such as cheese and crackers or a half turkey or peanut butter sandwich). °WHAT THINGS SHOULD I AVOID TO REDUCE MY SYMPTOMS? °The following things may help reduce your symptoms: °· Avoid foods with strong smells. Try eating meals in well-ventilated areas that are free of odors. °· Avoid drinking water or other beverages with meals. Try not to drink anything less than 30 minutes before and after meals. °· Avoid drinking more than 1 cup of fluid at a time. °· Avoid fried or high-fat foods, such as butter and cream sauces. °· Avoid spicy foods. °· Avoid skipping meals the best you can. Nausea can be more intense on an empty stomach. If you cannot tolerate food at that time, do not force it. Try sucking on  ice chips or other frozen items and make up the calories later. °· Avoid lying down within 2 hours after eating. °Document Released: 06/22/2007 Document Revised: 08/30/2013 Document Reviewed: 06/29/2013 °ExitCare® Patient Information ©2015 [employer]. This information is not intended to replace advice given to you by your health care provider. Make sure you discuss any questions you have with your health care provider. ° °Morning Sickness °Morning sickness is when you feel sick to your stomach (nauseous) during pregnancy. You may feel sick to your stomach and throw up (vomit). You may feel sick in the morning, but you can feel this way any time of day. Some women feel very sick to their stomach and cannot stop throwing up (hyperemesis gravidarum). °HOME CARE °· Only take medicines as told by your doctor. °· Take multivitamins as told by your doctor. Taking multivitamins before getting pregnant can stop or lessen the harshness of morning sickness. °· Eat dry toast or unsalted crackers before getting out of bed. °· Eat 5 to 6 small meals a day. °· Eat dry and bland foods like rice and baked potatoes. °· Do not drink liquids with meals. Drink between meals. °· Do not eat greasy, fatty, or spicy foods. °· Have someone cook for you if the smell of food causes you to feel sick or throw up. °· If you feel sick to your stomach after taking prenatal vitamins, take them at night or with a snack. °· Eat protein when you need a snack (nuts,   yogurt, cheese). °· Eat unsweetened gelatins for dessert. °· Wear a bracelet used for sea sickness (acupressure wristband). °· Go to a doctor that puts thin needles into certain body points (acupuncture) to improve how you feel. °· Do not smoke. °· Use a humidifier to keep the air in your house free of odors. °· Get lots of fresh air. °GET HELP IF: °· You need medicine to feel better. °· You feel dizzy or lightheaded. °· You are losing weight. °GET HELP RIGHT AWAY IF:  °· You feel very  sick to your stomach and cannot stop throwing up. °· You pass out (faint). °MAKE SURE YOU: °· Understand these instructions. °· Will watch your condition. °· Will get help right away if you are not doing well or get worse. °Document Released: 10/02/2004 Document Revised: 08/30/2013 Document Reviewed: 02/09/2013 °ExitCare® Patient Information ©2015 [employer]. This information is not intended to replace advice given to you by your health care provider. Make sure you discuss any questions you have with your health care provider. ° °

## 2015-03-26 NOTE — MAU Provider Note (Signed)
History     CSN: [id number]  Arrival date and time: 03/26/15 0112   None     Chief Complaint  Patient presents with  . Vomiting  . Heartburn   HPI Ms. Kristen Matthews is a 24 y.o. [id number] at 6072w3d who presents to MAU today with complaint of N/V. The patient has had N/V throughout the pregnancy. Last time she was seen in MAU she was given Rx for Phenergan. She has been taking that with good relief until about 1 week ago. She now feels that it is not helping as much. She last took Phenergan around 2100 yesterday. She also states associated severe heartburn noted recently. She has not taken anything for this. She denies diarrhea, but has had constipation x 3 weeks. She has used OTC fleets enemas recently with good results. She denies fever, abdominal pain or vaginal bleeding.   OB History    Gravida Para Term Preterm AB TAB SAB Ectopic Multiple Living   4 1 1  2 2    1       History reviewed. No pertinent past medical history.  Past Surgical History  Procedure Laterality Date  . Induced abortion      Family History  Problem Relation Age of Onset  . Diabetes Maternal Aunt   . Diabetes Maternal Uncle   . Heart disease Maternal Uncle   . Hypertension Maternal Grandfather   . Diabetes Maternal Grandfather   . Heart disease Maternal Grandfather   . COPD Maternal Grandfather   . Diabetes Paternal Grandmother     History  Substance Use Topics  . Smoking status: Never Smoker   . Smokeless tobacco: Not on file  . Alcohol Use: No    Allergies: No Known Allergies  Prescriptions prior to admission  Medication Sig Dispense Refill Last Dose  . glycopyrrolate (ROBINUL-FORTE) 2 MG tablet Take 1 tablet (2 mg total) by mouth 3 (three) times daily. 60 tablet 0 03/09/2015 at Unknown time  . polyethylene glycol powder (GLYCOLAX/MIRALAX) powder Take 255 g by mouth once. 255 g 0   . promethazine (PHENERGAN) 25 MG tablet Take 1 tablet (25 mg total) by mouth every 6 (six) hours as needed for  nausea or vomiting. 60 tablet 2     Review of Systems  Constitutional: Negative for fever and malaise/fatigue.  Gastrointestinal: Positive for nausea, vomiting and constipation. Negative for abdominal pain and diarrhea.  Genitourinary: Negative for dysuria, urgency and frequency.       Neg - vaginal bleeding   Physical Exam   Last menstrual period 01/12/2015.  Physical Exam  Nursing note and vitals reviewed. Constitutional: She is oriented to person, place, and time. She appears well-developed and well-nourished. No distress.  HENT:  Head: Normocephalic and atraumatic.  Cardiovascular: Normal rate.   Respiratory: Effort normal.  GI: Soft. She exhibits no distension and no mass. There is no tenderness. There is no rebound, no guarding and no CVA tenderness.  Neurological: She is alert and oriented to person, place, and time.  Skin: Skin is warm and dry. No erythema.  Psychiatric: She has a normal mood and affect.   Results for orders placed or performed during the hospital encounter of 03/26/15 (from the past 24 hour(s))  Urinalysis, Routine w reflex microscopic (not at [facility])     Status: Abnormal   Collection Time: 03/26/15  1:30 AM  Result Value Ref Range   Color, Urine Elody (A) YELLOW   APPearance CLEAR CLEAR   Specific Gravity, Urine >1.030 (H)  1.005 - 1.030   pH 6.0 5.0 - 8.0   Glucose, UA NEGATIVE NEGATIVE mg/dL   Hgb urine dipstick NEGATIVE NEGATIVE   Bilirubin Urine MODERATE (A) NEGATIVE   Ketones, ur 40 (A) NEGATIVE mg/dL   Protein, ur 30 (A) NEGATIVE mg/dL   Urobilinogen, UA 2.0 (H) 0.0 - 1.0 mg/dL   Nitrite NEGATIVE NEGATIVE   Leukocytes, UA NEGATIVE NEGATIVE  Urine microscopic-add on     Status: None   Collection Time: 03/26/15  1:30 AM  Result Value Ref Range   Squamous Epithelial / LPF RARE RARE   WBC, UA 0-2 <3 WBC/hpf   RBC / HPF 0-2 <3 RBC/hpf   Bacteria, UA RARE RARE   Urine-Other MUCOUS PRESENT   CBC with Differential/Platelet     Status: Abnormal    Collection Time: 03/26/15  2:05 AM  Result Value Ref Range   WBC 8.9 4.0 - 10.5 K/uL   RBC 5.22 (H) 3.87 - 5.11 MIL/uL   Hemoglobin 15.1 (H) 12.0 - 15.0 g/dL   HCT 64.4 03.4 - 74.2 %   MCV 79.5 78.0 - 100.0 fL   MCH 28.9 26.0 - 34.0 pg   MCHC 36.4 (H) 30.0 - 36.0 g/dL   RDW 59.5 63.8 - 75.6 %   Platelets 301 150 - 400 K/uL   Neutrophils Relative % 71 43 - 77 %   Neutro Abs 6.4 1.7 - 7.7 K/uL   Lymphocytes Relative 19 12 - 46 %   Lymphs Abs 1.7 0.7 - 4.0 K/uL   Monocytes Relative 9 3 - 12 %   Monocytes Absolute 0.8 0.1 - 1.0 K/uL   Eosinophils Relative 1 0 - 5 %   Eosinophils Absolute 0.1 0.0 - 0.7 K/uL   Basophils Relative 0 0 - 1 %   Basophils Absolute 0.0 0.0 - 0.1 K/uL  Comprehensive metabolic panel     Status: Abnormal   Collection Time: 03/26/15  2:05 AM  Result Value Ref Range   Sodium 133 (L) 135 - 145 mmol/L   Potassium 3.5 3.5 - 5.1 mmol/L   Chloride 104 101 - 111 mmol/L   CO2 21 (L) 22 - 32 mmol/L   Glucose, Bld 118 (H) 65 - 99 mg/dL   BUN 8 6 - 20 mg/dL   Creatinine, Ser 4.33 0.44 - 1.00 mg/dL   Calcium 9.8 8.9 - 29.5 mg/dL   Total Protein 8.0 6.5 - 8.1 g/dL   Albumin 4.0 3.5 - 5.0 g/dL   AST 36 15 - 41 U/L   ALT 37 14 - 54 U/L   Alkaline Phosphatase 48 38 - 126 U/L   Total Bilirubin 1.3 (H) 0.3 - 1.2 mg/dL   GFR calc non Af Amer >60 >60 mL/min   GFR calc Af Amer >60 >60 mL/min   Anion gap 8 5 - 15    MAU Course  Procedures None  MDM UA, CBC, CMP today 1 liter IV LR with 25 mg Phenergan given in MAU 20 mg Pepcid IV Urine culture pending Patient reports improvement in symptoms. Able to tolerate PO in MAU Assessment and Plan  A: SIUP at [redacted]w[redacted]d Nausea and vomiting in pregnancy prior to [redacted] weeks gestation UTI  P: Discharge home Rx for Keflex, Pepcid and Colace given to patient First trimester precautions discussed Urine culture pending Patient advised to follow-up with OB provider of choice to start prenatal care Patient may return to MAU as  needed or if her condition were to change or worsen  Marny Lowenstein, PA-C  03/26/2015, 3:37 AM

## 2015-03-26 NOTE — MAU Note (Signed)
Pt states she has been on meds for hyperemesis and they had been helping until the last few days and now she is vomiting and has really bad heartburn.

## 2015-03-27 LAB — CULTURE, OB URINE

## 2015-04-02 LAB — OB RESULTS CONSOLE ANTIBODY SCREEN: Antibody Screen: NEGATIVE

## 2015-04-02 LAB — OB RESULTS CONSOLE GC/CHLAMYDIA
CHLAMYDIA, DNA PROBE: POSITIVE
GC PROBE AMP, GENITAL: NEGATIVE

## 2015-04-02 LAB — OB RESULTS CONSOLE ABO/RH: RH TYPE: POSITIVE

## 2015-04-02 LAB — OB RESULTS CONSOLE RUBELLA ANTIBODY, IGM: Rubella: IMMUNE

## 2015-04-02 LAB — OB RESULTS CONSOLE HEPATITIS B SURFACE ANTIGEN: Hepatitis B Surface Ag: NEGATIVE

## 2015-04-02 LAB — OB RESULTS CONSOLE RPR: RPR: NONREACTIVE

## 2015-04-02 LAB — OB RESULTS CONSOLE HIV ANTIBODY (ROUTINE TESTING): HIV: NONREACTIVE

## 2015-04-09 ENCOUNTER — Other Ambulatory Visit (HOSPITAL_COMMUNITY): Payer: Self-pay | Admitting: Nurse Practitioner

## 2015-04-09 DIAGNOSIS — Z3689 Encounter for other specified antenatal screening: Secondary | ICD-10-CM

## 2015-04-09 DIAGNOSIS — Z3A12 12 weeks gestation of pregnancy: Secondary | ICD-10-CM

## 2015-04-09 DIAGNOSIS — Z3A18 18 weeks gestation of pregnancy: Secondary | ICD-10-CM

## 2015-04-09 DIAGNOSIS — Z3682 Encounter for antenatal screening for nuchal translucency: Secondary | ICD-10-CM

## 2015-04-11 ENCOUNTER — Ambulatory Visit (HOSPITAL_COMMUNITY): Admission: RE | Admit: 2015-04-11 | Payer: Medicaid Other | Source: Ambulatory Visit

## 2015-04-11 ENCOUNTER — Ambulatory Visit (HOSPITAL_COMMUNITY)
Admission: RE | Admit: 2015-04-11 | Discharge: 2015-04-11 | Disposition: A | Discharge status: Home / Self Care | Payer: Medicaid Other | Source: Ambulatory Visit | Attending: Nurse Practitioner | Admitting: Nurse Practitioner

## 2015-04-11 ENCOUNTER — Encounter (HOSPITAL_COMMUNITY): Payer: Self-pay

## 2015-04-11 DIAGNOSIS — Z36 Encounter for antenatal screening of mother: Secondary | ICD-10-CM | POA: Diagnosis present

## 2015-04-11 DIAGNOSIS — Z3A12 12 weeks gestation of pregnancy: Secondary | ICD-10-CM | POA: Insufficient documentation

## 2015-04-11 DIAGNOSIS — Z3682 Encounter for antenatal screening for nuchal translucency: Secondary | ICD-10-CM

## 2015-04-12 ENCOUNTER — Other Ambulatory Visit (HOSPITAL_COMMUNITY): Payer: Self-pay | Admitting: Nurse Practitioner

## 2015-04-12 ENCOUNTER — Other Ambulatory Visit (HOSPITAL_COMMUNITY): Payer: Self-pay

## 2015-04-12 DIAGNOSIS — Z3682 Encounter for antenatal screening for nuchal translucency: Secondary | ICD-10-CM

## 2015-04-16 ENCOUNTER — Other Ambulatory Visit (HOSPITAL_COMMUNITY): Payer: Self-pay | Admitting: Nurse Practitioner

## 2015-04-16 ENCOUNTER — Other Ambulatory Visit (HOSPITAL_COMMUNITY): Payer: Medicaid Other

## 2015-04-16 ENCOUNTER — Ambulatory Visit (HOSPITAL_COMMUNITY)
Admission: RE | Admit: 2015-04-16 | Discharge: 2015-04-16 | Disposition: A | Discharge status: Home / Self Care | Payer: Medicaid Other | Source: Ambulatory Visit | Attending: Nurse Practitioner | Admitting: Nurse Practitioner

## 2015-04-16 DIAGNOSIS — Z3A13 13 weeks gestation of pregnancy: Secondary | ICD-10-CM

## 2015-04-16 DIAGNOSIS — O210 Mild hyperemesis gravidarum: Secondary | ICD-10-CM

## 2015-04-16 DIAGNOSIS — Z36 Encounter for antenatal screening of mother: Secondary | ICD-10-CM | POA: Insufficient documentation

## 2015-04-16 DIAGNOSIS — Z3682 Encounter for antenatal screening for nuchal translucency: Secondary | ICD-10-CM

## 2015-05-18 ENCOUNTER — Ambulatory Visit (HOSPITAL_COMMUNITY)
Admission: RE | Admit: 2015-05-18 | Discharge: 2015-05-18 | Disposition: A | Discharge status: Home / Self Care | Payer: Medicaid Other | Source: Ambulatory Visit | Attending: Nurse Practitioner | Admitting: Nurse Practitioner

## 2015-05-18 ENCOUNTER — Other Ambulatory Visit (HOSPITAL_COMMUNITY): Payer: Self-pay | Admitting: Nurse Practitioner

## 2015-05-18 DIAGNOSIS — Z3689 Encounter for other specified antenatal screening: Secondary | ICD-10-CM

## 2015-05-18 DIAGNOSIS — Z36 Encounter for antenatal screening of mother: Secondary | ICD-10-CM | POA: Insufficient documentation

## 2015-05-18 DIAGNOSIS — Z3A18 18 weeks gestation of pregnancy: Secondary | ICD-10-CM | POA: Insufficient documentation

## 2015-05-18 DIAGNOSIS — O35EXX0 Maternal care for other (suspected) fetal abnormality and damage, fetal genitourinary anomalies, not applicable or unspecified: Secondary | ICD-10-CM

## 2015-05-18 DIAGNOSIS — O358XX0 Maternal care for other (suspected) fetal abnormality and damage, not applicable or unspecified: Secondary | ICD-10-CM

## 2015-05-30 LAB — OB RESULTS CONSOLE GC/CHLAMYDIA: CHLAMYDIA, DNA PROBE: NEGATIVE

## 2015-06-29 ENCOUNTER — Other Ambulatory Visit (HOSPITAL_COMMUNITY): Payer: Self-pay | Admitting: Maternal and Fetal Medicine

## 2015-06-29 ENCOUNTER — Ambulatory Visit (HOSPITAL_COMMUNITY)
Admission: RE | Admit: 2015-06-29 | Discharge: 2015-06-29 | Disposition: A | Discharge status: Home / Self Care | Payer: Medicaid Other | Source: Ambulatory Visit | Attending: Nurse Practitioner | Admitting: Nurse Practitioner

## 2015-06-29 DIAGNOSIS — Z3A24 24 weeks gestation of pregnancy: Secondary | ICD-10-CM | POA: Insufficient documentation

## 2015-06-29 DIAGNOSIS — O283 Abnormal ultrasonic finding on antenatal screening of mother: Secondary | ICD-10-CM | POA: Diagnosis not present

## 2015-06-29 DIAGNOSIS — O358XX0 Maternal care for other (suspected) fetal abnormality and damage, not applicable or unspecified: Secondary | ICD-10-CM

## 2015-06-29 DIAGNOSIS — O35EXX0 Maternal care for other (suspected) fetal abnormality and damage, fetal genitourinary anomalies, not applicable or unspecified: Secondary | ICD-10-CM

## 2015-08-04 IMAGING — US US MFM FETAL NUCHAL TRANSLUCENCY
1 series · 14 of 23 positions shown · non-contrast
Comparison: none

[Series 1: us mfm fetal nuchal translucency · 0.23mm/px · 14 of 23 slices shown]
[im 1/23]
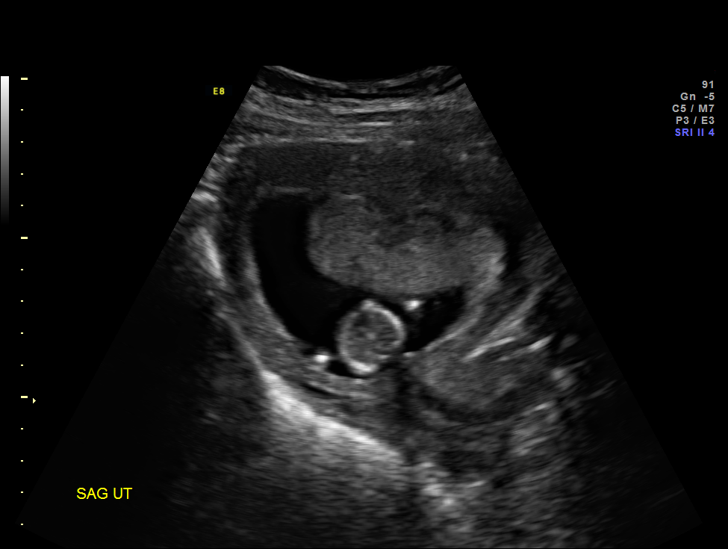
[im 3/23]
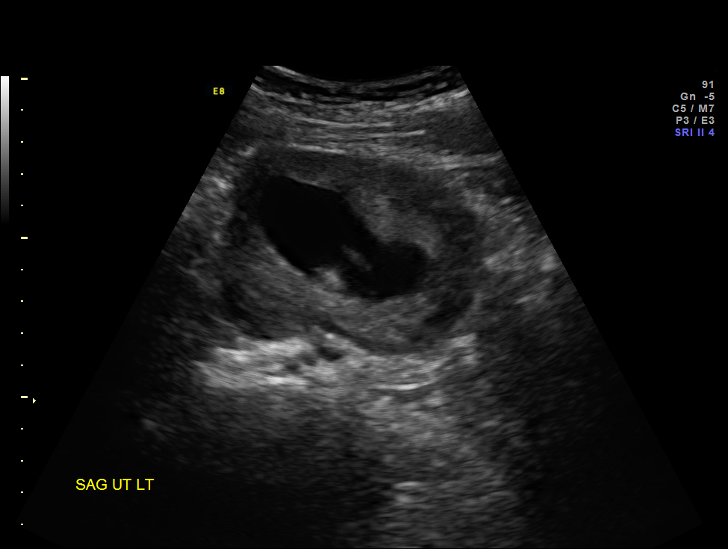
[im 5/23]
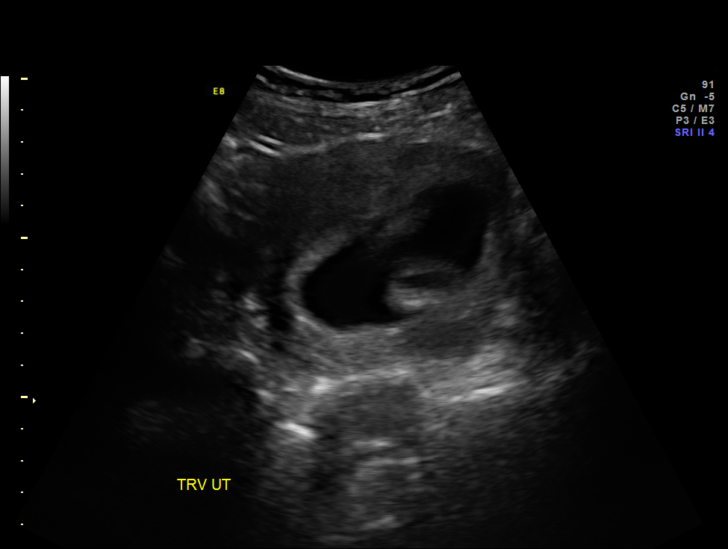
[im 6/23]
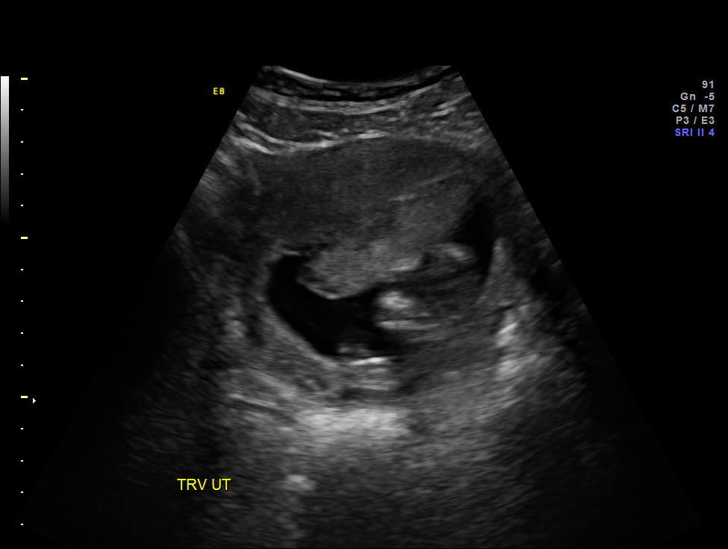
[im 8/23]
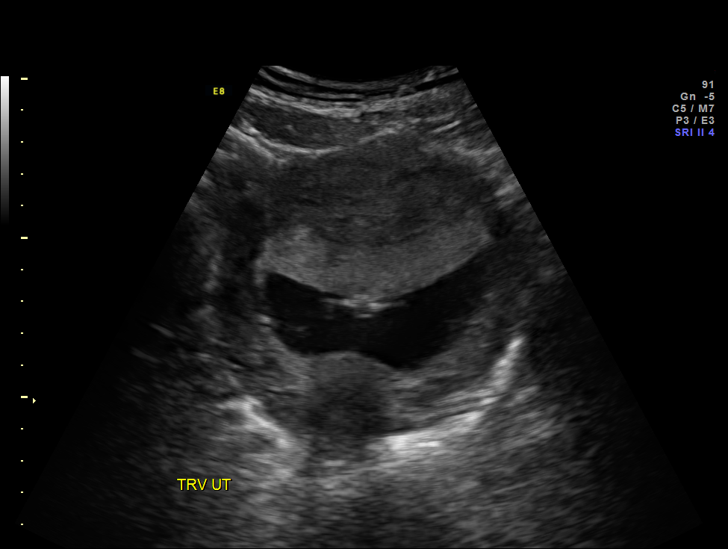
[im 10/23]
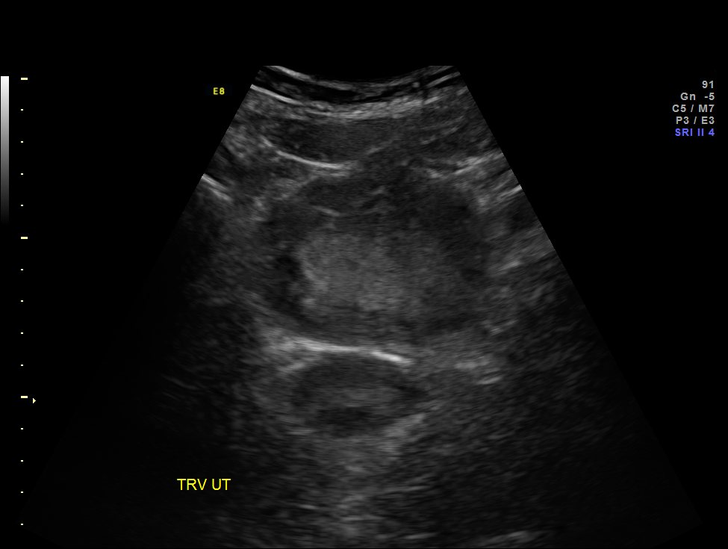
[im 11/23]
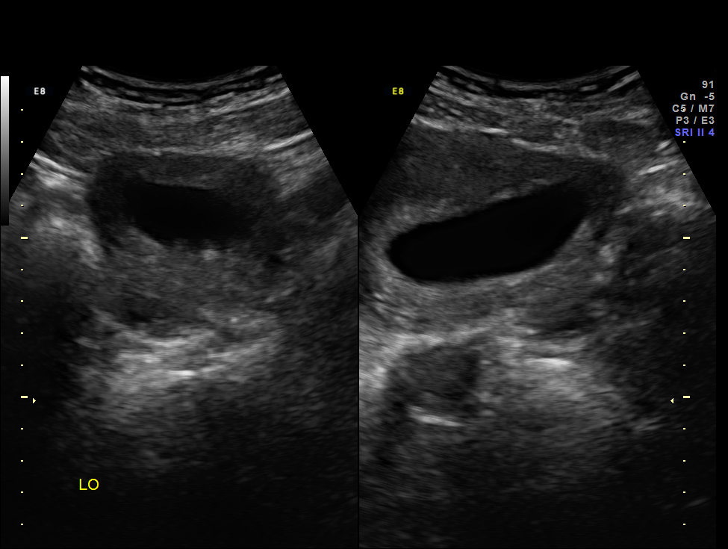
[im 13/23]
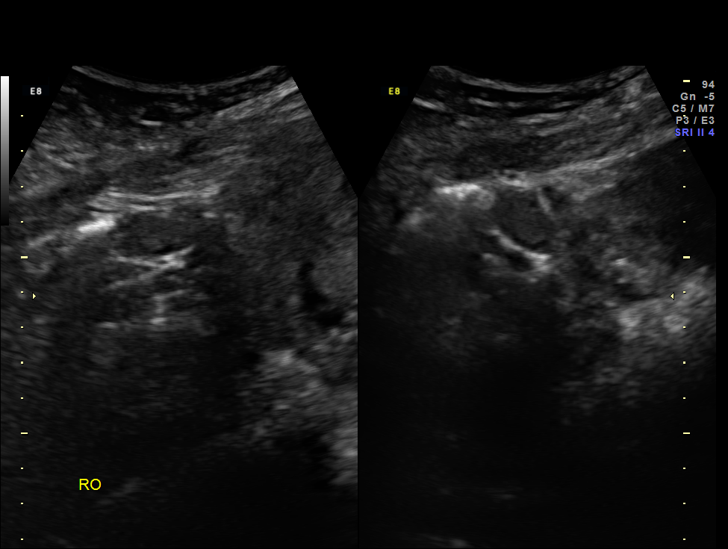
[im 14/23]
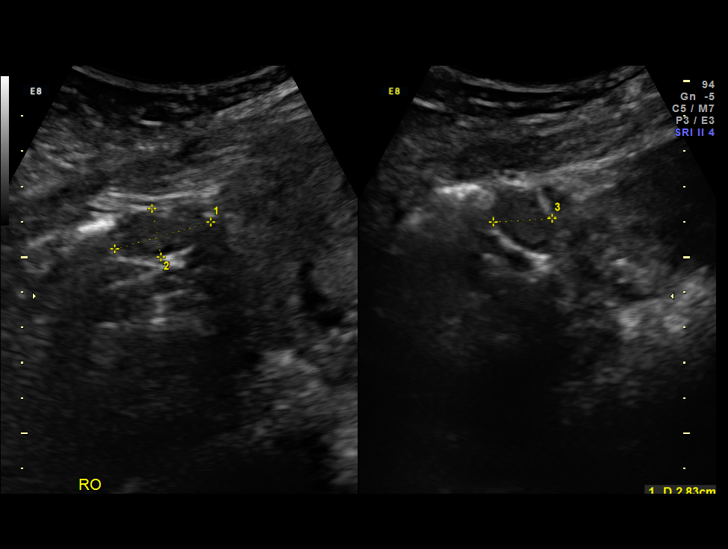
[im 16/23]
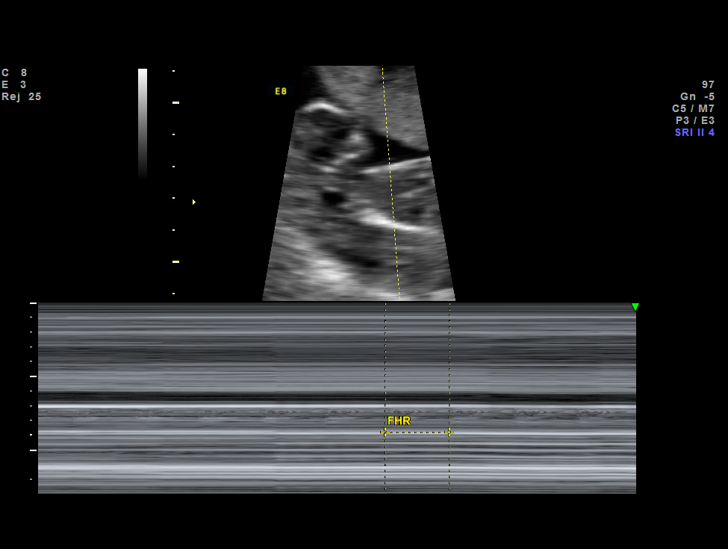
[im 18/23]
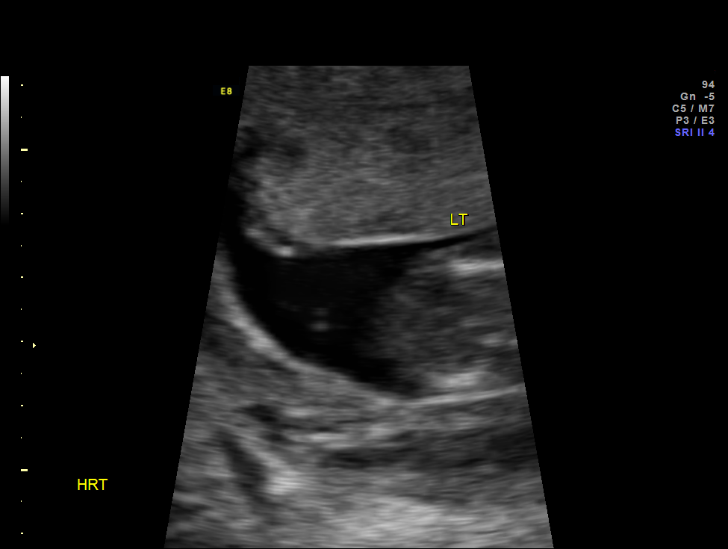
[im 19/23]
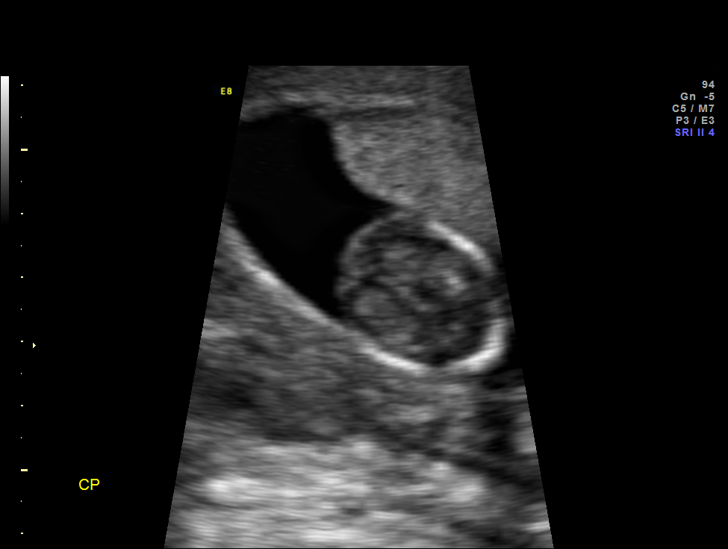
[im 21/23]
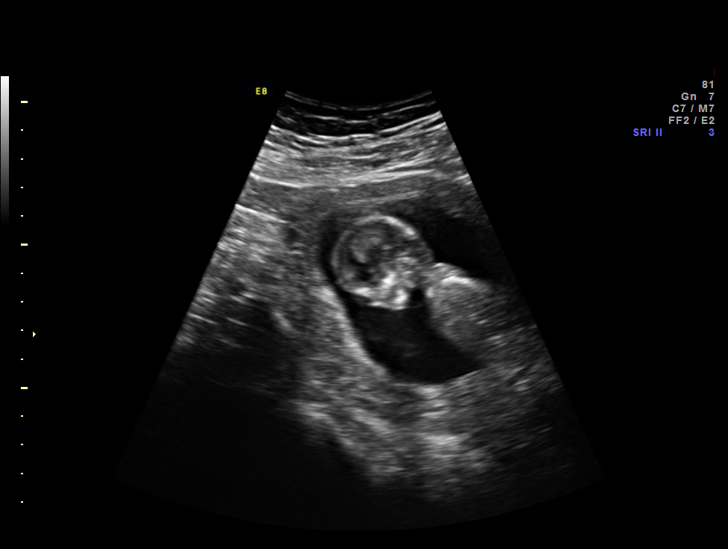
[im 23/23]
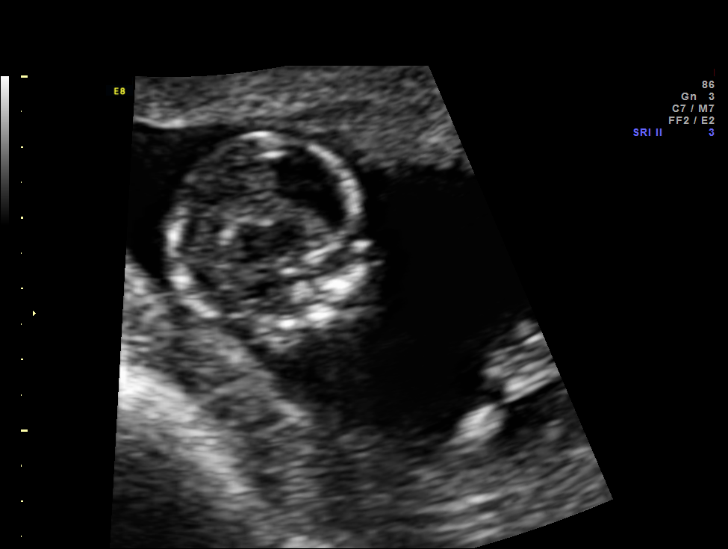

[14 of 23 positions shown; findings below may reference images not displayed]

OBSTETRICS REPORT
(Signed Final 04/11/2015 [DATE])

Name:       BENRABAH ETOIL                         Visit  04/11/2015 [DATE]
Date:

Service(s) Provided

Indications

First trimester aneuploidy screen (NT)                 Z36
12 weeks gestation of pregnancy
Hyperemesis gravidarum
Fetal Evaluation

Num Of             1
Fetuses:
Preg. Location:    Intrauterine
Gest. Sac:         Intrauterine
Fetal Pole:        Visualized
Fetal Heart        157                          bpm
Rate:
Cardiac Activity:  Observed
Gestational Age

LMP:           12w 5d        Date:  01/12/15                  EDD:   10/19/15
Best:          12w 5d    Det. By:   LMP  (01/12/15)           EDD:   10/19/15
Cervix Uterus Adnexa

Cervix:       Closed.
Uterus:       No abnormality visualized.
Cul De Sac:   No free fluid seen.

Left Ovary:    Within normal limits.
Right Ovary:   Within normal limits.

Adnexa:     No abnormality visualized.
Impression

Single IUP at 12w 5d
Unable to obtain and adequate NT due to fetal position
Normal amniotic fluid volume
Recommendations

Recommend follow-up ultrasound examination early next
week for second attempt for an NT evaluation.

## 2015-08-10 ENCOUNTER — Other Ambulatory Visit (HOSPITAL_COMMUNITY): Payer: Self-pay | Admitting: Maternal and Fetal Medicine

## 2015-08-10 ENCOUNTER — Ambulatory Visit (HOSPITAL_COMMUNITY)
Admission: RE | Admit: 2015-08-10 | Discharge: 2015-08-10 | Disposition: A | Discharge status: Home / Self Care | Payer: Medicaid Other | Source: Ambulatory Visit | Attending: Maternal and Fetal Medicine | Admitting: Maternal and Fetal Medicine

## 2015-08-10 ENCOUNTER — Other Ambulatory Visit (HOSPITAL_COMMUNITY): Payer: Self-pay

## 2015-08-10 DIAGNOSIS — Z3A30 30 weeks gestation of pregnancy: Secondary | ICD-10-CM | POA: Diagnosis not present

## 2015-08-10 DIAGNOSIS — O283 Abnormal ultrasonic finding on antenatal screening of mother: Secondary | ICD-10-CM | POA: Diagnosis not present

## 2015-08-10 DIAGNOSIS — O358XX0 Maternal care for other (suspected) fetal abnormality and damage, not applicable or unspecified: Secondary | ICD-10-CM

## 2015-08-10 DIAGNOSIS — O35EXX0 Maternal care for other (suspected) fetal abnormality and damage, fetal genitourinary anomalies, not applicable or unspecified: Secondary | ICD-10-CM

## 2015-08-15 ENCOUNTER — Other Ambulatory Visit (HOSPITAL_COMMUNITY): Payer: Self-pay | Admitting: Nurse Practitioner

## 2015-08-15 DIAGNOSIS — Z3A34 34 weeks gestation of pregnancy: Secondary | ICD-10-CM

## 2015-08-15 DIAGNOSIS — O35EXX1 Maternal care for other (suspected) fetal abnormality and damage, fetal genitourinary anomalies, fetus 1: Secondary | ICD-10-CM

## 2015-08-15 DIAGNOSIS — O358XX1 Maternal care for other (suspected) fetal abnormality and damage, fetus 1: Secondary | ICD-10-CM

## 2015-08-24 ENCOUNTER — Ambulatory Visit (HOSPITAL_COMMUNITY): Payer: Medicaid Other

## 2015-09-07 ENCOUNTER — Ambulatory Visit (HOSPITAL_COMMUNITY)
Admission: RE | Admit: 2015-09-07 | Discharge: 2015-09-07 | Disposition: A | Discharge status: Home / Self Care | Payer: Medicaid Other | Source: Ambulatory Visit | Attending: Nurse Practitioner | Admitting: Nurse Practitioner

## 2015-09-07 DIAGNOSIS — O35EXX1 Maternal care for other (suspected) fetal abnormality and damage, fetal genitourinary anomalies, fetus 1: Secondary | ICD-10-CM

## 2015-09-07 DIAGNOSIS — O283 Abnormal ultrasonic finding on antenatal screening of mother: Secondary | ICD-10-CM | POA: Insufficient documentation

## 2015-09-07 DIAGNOSIS — O358XX1 Maternal care for other (suspected) fetal abnormality and damage, fetus 1: Secondary | ICD-10-CM

## 2015-09-07 DIAGNOSIS — Z3A34 34 weeks gestation of pregnancy: Secondary | ICD-10-CM | POA: Diagnosis not present

## 2015-09-09 NOTE — L&D Delivery Note (Signed)
Patient is 25 y.o. [id number] [redacted]w[redacted]d admitted for SOL, hx of UTI, chlamydia, persistent mild bilateral pyelectasis, anemia. No augmentation of labor.    Delivery Note At 9:55 PM a viable and healthy female was delivered via Vaginal, Spontaneous Delivery (Presentation: cephalic; Right Occiput Anterior).  APGAR: 9, 9; weight pending .   Placenta status: intact.  Cord: 3 vessels with the following complications: None.  Anesthesia: Epidural  Episiotomy: None Lacerations: 1st degree Suture Repair: none Est. Blood Loss (mL):  50cc  Mom to postpartum.  Baby to Couplet care / Skin to Skin.  Jerita Heckart 10/18/2015, 10:20 PM      Upon arrival patient was complete and pushing. She pushed with good maternal effort to deliver a healthy baby boy. Baby delivered without difficulty, was noted to have good tone and place on maternal abdomen for oral suctioning, drying and stimulation. Delayed cord clamping performed. Placenta delivered intact with 3V cord. Vaginal canal and perineum was inspected and 1st degree laceration was hemostatic, no repair needed. Pitocin was started and uterus massaged until bleeding slowed. Counts of sharps, instruments, and lap pads were all correct.   Wynne Dust, MD, PGY-1  I was present for the above and agree. CRESENZO-DISHMAN,Ramesh Moan

## 2015-09-27 LAB — OB RESULTS CONSOLE GC/CHLAMYDIA
CHLAMYDIA, DNA PROBE: NEGATIVE
GC PROBE AMP, GENITAL: NEGATIVE

## 2015-09-27 LAB — OB RESULTS CONSOLE GBS: GBS: NEGATIVE

## 2015-10-18 ENCOUNTER — Encounter (HOSPITAL_COMMUNITY): Payer: Self-pay | Admitting: *Deleted

## 2015-10-18 ENCOUNTER — Inpatient Hospital Stay (HOSPITAL_COMMUNITY): Payer: Medicaid Other | Admitting: Anesthesiology

## 2015-10-18 ENCOUNTER — Inpatient Hospital Stay (HOSPITAL_COMMUNITY)
Admission: AD | Admit: 2015-10-18 | Discharge: 2015-10-20 | DRG: 775 | Disposition: A | Discharge status: Home / Self Care | Payer: Medicaid Other | Source: Ambulatory Visit | Attending: Obstetrics & Gynecology | Admitting: Obstetrics & Gynecology

## 2015-10-18 DIAGNOSIS — Z3A39 39 weeks gestation of pregnancy: Secondary | ICD-10-CM

## 2015-10-18 DIAGNOSIS — O9902 Anemia complicating childbirth: Principal | ICD-10-CM | POA: Diagnosis present

## 2015-10-18 DIAGNOSIS — K219 Gastro-esophageal reflux disease without esophagitis: Secondary | ICD-10-CM | POA: Diagnosis present

## 2015-10-18 DIAGNOSIS — O9962 Diseases of the digestive system complicating childbirth: Secondary | ICD-10-CM | POA: Diagnosis present

## 2015-10-18 DIAGNOSIS — Z833 Family history of diabetes mellitus: Secondary | ICD-10-CM | POA: Diagnosis not present

## 2015-10-18 DIAGNOSIS — D649 Anemia, unspecified: Secondary | ICD-10-CM | POA: Diagnosis present

## 2015-10-18 DIAGNOSIS — Z8249 Family history of ischemic heart disease and other diseases of the circulatory system: Secondary | ICD-10-CM

## 2015-10-18 LAB — CBC
HEMATOCRIT: 31.8 % — AB (ref 36.0–46.0)
HEMOGLOBIN: 10.4 g/dL — AB (ref 12.0–15.0)
MCH: 24.5 pg — AB (ref 26.0–34.0)
MCHC: 32.7 g/dL (ref 30.0–36.0)
MCV: 74.8 fL — AB (ref 78.0–100.0)
Platelets: 312 10*3/uL (ref 150–400)
RBC: 4.25 MIL/uL (ref 3.87–5.11)
RDW: 15.5 % (ref 11.5–15.5)
WBC: 12.3 10*3/uL — ABNORMAL HIGH (ref 4.0–10.5)

## 2015-10-18 LAB — ABO/RH: ABO/RH(D): O POS

## 2015-10-18 LAB — TYPE AND SCREEN
ABO/RH(D): O POS
ANTIBODY SCREEN: NEGATIVE

## 2015-10-18 MED ORDER — LIDOCAINE HCL (PF) 1 % IJ SOLN
INTRAMUSCULAR | Status: DC | PRN
  Administered 2015-10-18: 5 mL via EPIDURAL

## 2015-10-18 MED ORDER — CITRIC ACID-SODIUM CITRATE 334-500 MG/5ML PO SOLN: 30.0000 mL | ORAL | Status: DC | PRN

## 2015-10-18 MED ORDER — SODIUM BICARBONATE 8.4 % IV SOLN
INTRAVENOUS | Status: DC | PRN
  Administered 2015-10-18: 2 mL via EPIDURAL
  Administered 2015-10-18: 3 mL via EPIDURAL

## 2015-10-18 MED ORDER — LACTATED RINGERS IV SOLN
INTRAVENOUS | Status: DC
  Administered 2015-10-18: 125 mL/h via INTRAVENOUS

## 2015-10-18 MED ORDER — ONDANSETRON HCL 4 MG/2ML IJ SOLN: 4.0000 mg | Freq: Four times a day (QID) | INTRAMUSCULAR | Status: DC | PRN

## 2015-10-18 MED ORDER — LACTATED RINGERS IV SOLN
2.5000 [IU]/h | INTRAVENOUS | Status: DC
  Administered 2015-10-18: 2.5 [IU]/h via INTRAVENOUS
  Filled 2015-10-18: qty 4

## 2015-10-18 MED ORDER — EPHEDRINE 5 MG/ML INJ
10.0000 mg | INTRAVENOUS | Status: DC | PRN
Start: 2015-10-18 — End: 2015-10-19
  Filled 2015-10-18: qty 2

## 2015-10-18 MED ORDER — PHENYLEPHRINE 40 MCG/ML (10ML) SYRINGE FOR IV PUSH (FOR BLOOD PRESSURE SUPPORT)
80.0000 ug | PREFILLED_SYRINGE | INTRAVENOUS | Status: DC | PRN
  Filled 2015-10-18: qty 2
  Filled 2015-10-18: qty 20

## 2015-10-18 MED ORDER — LACTATED RINGERS IV SOLN
500.0000 mL | INTRAVENOUS | Status: DC | PRN
  Administered 2015-10-18: 1000 mL via INTRAVENOUS

## 2015-10-18 MED ORDER — OXYCODONE-ACETAMINOPHEN 5-325 MG PO TABS: 2.0000 | ORAL_TABLET | ORAL | Status: DC | PRN

## 2015-10-18 MED ORDER — LIDOCAINE HCL (PF) 1 % IJ SOLN
30.0000 mL | INTRAMUSCULAR | Status: DC | PRN
  Filled 2015-10-18: qty 30

## 2015-10-18 MED ORDER — FENTANYL 2.5 MCG/ML BUPIVACAINE 1/10 % EPIDURAL INFUSION (WH - ANES)
14.0000 mL/h | INTRAMUSCULAR | Status: DC | PRN
  Administered 2015-10-18: 14 mL/h via EPIDURAL
  Filled 2015-10-18: qty 125

## 2015-10-18 MED ORDER — DIPHENHYDRAMINE HCL 50 MG/ML IJ SOLN: 12.5000 mg | INTRAMUSCULAR | Status: DC | PRN

## 2015-10-18 MED ORDER — OXYTOCIN 10 UNIT/ML IJ SOLN
INTRAMUSCULAR | Status: AC
  Filled 2015-10-18: qty 1

## 2015-10-18 MED ORDER — OXYTOCIN BOLUS FROM INFUSION
500.0000 mL | INTRAVENOUS | Status: DC
  Administered 2015-10-18: 500 mL via INTRAVENOUS

## 2015-10-18 MED ORDER — ACETAMINOPHEN 325 MG PO TABS: 650.0000 mg | ORAL_TABLET | ORAL | Status: DC | PRN

## 2015-10-18 MED ORDER — OXYCODONE-ACETAMINOPHEN 5-325 MG PO TABS: 1.0000 | ORAL_TABLET | ORAL | Status: DC | PRN

## 2015-10-18 NOTE — Anesthesia Preprocedure Evaluation (Signed)
Anesthesia Evaluation  Patient identified by MRN, date of birth, ID band Patient awake    Reviewed: Allergy & Precautions, NPO status , Patient's Chart, lab work & pertinent test results  History of Anesthesia Complications Negative for: history of anesthetic complications  Airway Mallampati: II  TM Distance: >3 FB Neck ROM: Full    Dental  (+) Teeth Intact, Dental Advisory Given   Pulmonary neg pulmonary ROS,    Pulmonary exam normal breath sounds clear to auscultation       Cardiovascular Exercise Tolerance: Good negative cardio ROS Normal cardiovascular exam Rhythm:Regular Rate:Normal     Neuro/Psych negative neurological ROS  negative psych ROS   GI/Hepatic Neg liver ROS, GERD  Medicated,  Endo/Other  negative endocrine ROS  Renal/GU negative Renal ROS     Musculoskeletal negative musculoskeletal ROS (+)   Abdominal   Peds  Hematology  (+) Blood dyscrasia, anemia , Plt 312k   Anesthesia Other Findings Day of surgery medications reviewed with the patient.  Reproductive/Obstetrics (+) Pregnancy                             Anesthesia Physical Anesthesia Plan  ASA: II  Anesthesia Plan: Epidural   Post-op Pain Management:    Induction:   Airway Management Planned:   Additional Equipment:   Intra-op Plan:   Post-operative Plan:   Informed Consent: I have reviewed the patients History and Physical, chart, labs and discussed the procedure including the risks, benefits and alternatives for the proposed anesthesia with the patient or authorized representative who has indicated his/her understanding and acceptance.   Dental advisory given  Plan Discussed with:   Anesthesia Plan Comments: (Patient identified. Risks/Benefits/Options discussed with patient including but not limited to bleeding, infection, nerve damage, paralysis, failed block, incomplete pain control, headache,  blood pressure changes, nausea, vomiting, reactions to medication both or allergic, itching and postpartum back pain. Confirmed with bedside nurse the patient's most recent platelet count. Confirmed with patient that they are not currently taking any anticoagulation, have any bleeding history or any family history of bleeding disorders. Patient expressed understanding and wished to proceed. All questions were answered. )        Anesthesia Quick Evaluation

## 2015-10-18 NOTE — H&P (Signed)
LABOR ADMISSION HISTORY AND PHYSICAL  Kristen Matthews is a 25 y.o. female [phone] with IUP at [redacted]w[redacted]d by LMP c/w [redacted]w[redacted]d [place] presenting for SOL. She reports +FM, + contractions, No LOF, no VB, no blurry vision, headaches or peripheral edema, and RUQ pain.  She plans on breast feeding. Unable to discus preferred method of contraception as patient is in active labor.  Dating: By LMP c/w 18 w US--->  Estimated Date of Delivery: 10/19/15  Sono:    , CWD, normal anatomy other than bilateral dilation of urinary tract (which resolved later on), 251g, 60% EFW , normal anatomy, 2178g 47% EFW   Prenatal History/Complications: + Chlamydia during pregnancy (03/2015)-treated UTI during pregnancy- treated; neg TOC Persistent mild bilateral pyelectasis that resolved 09/07/15 Anemia  Past Medical History: History reviewed. No pertinent past medical history.  Past Surgical History: Past Surgical History  Procedure Laterality Date  . Induced abortion      Obstetrical History: OB History    Gravida Para Term Preterm AB TAB SAB Ectopic Multiple Living   Social History: Social History   Social History  . Marital Status: Single    Spouse Name: N/A  . Number of Children: N/A  . Years of Education: N/A   Social History Main Topics  . Smoking status: Never Smoker   . Smokeless tobacco: None  . Alcohol Use: No  . Drug Use: No  . Sexual Activity: Not Asked   Other Topics Concern  . None   Social History Narrative    Family History: Family History  Problem Relation Age of Onset  . Diabetes Maternal Aunt   . Diabetes Maternal Uncle   . Heart disease Maternal Uncle   . Hypertension Maternal Grandfather   . Diabetes Maternal Grandfather   . Heart disease Maternal Grandfather   . COPD Maternal Grandfather   . Diabetes Paternal Grandmother     Allergies: No Known Allergies  Prescriptions prior to admission  Medication Sig Dispense Refill Last Dose  .  cephALEXin (KEFLEX) 500 MG capsule Take 1 capsule (500 mg total) by mouth 4 (four) times daily. (Patient not taking: Reported on 04/11/2015) 28 capsule 0 Not Taking  . docusate sodium (COLACE) 100 MG capsule Take 1 capsule (100 mg total) by mouth every 12 (twelve) hours. (Patient not taking: Reported on 06/29/2015) 60 capsule 0 Not Taking  . famotidine (PEPCID) 20 MG tablet Take 1 tablet (20 mg total) by mouth 2 (two) times daily. (Patient not taking: Reported on 06/29/2015) 30 tablet 3 Not Taking  . glycopyrrolate (ROBINUL-FORTE) 2 MG tablet Take 1 tablet (2 mg total) by mouth 3 (three) times daily. (Patient not taking: Reported on 04/11/2015) 60 tablet 0 Not Taking  . polyethylene glycol powder (GLYCOLAX/MIRALAX) powder Take 255 g by mouth once. (Patient not taking: Reported on 04/11/2015) 255 g 0 Not Taking  . promethazine (PHENERGAN) 25 MG tablet Take 1 tablet (25 mg total) by mouth every 6 (six) hours as needed for nausea or vomiting. (Patient not taking: Reported on 06/29/2015) 60 tablet 2 Not Taking     Review of Systems   All systems reviewed and negative except as stated in HPI  BP 114/71 mmHg  Pulse 93  Temp(Src) 97.8 F (36.6 C) (Oral)  Resp 20  SpO2 100%  LMP 01/12/2015 General appearance: alert and cooperative Lungs: clear to auscultation bilaterally Heart: regular rate and rhythm Abdomen: soft, non-tender; bowel sounds normal  Extremities: Homans sign is negative, no sign of DVT, edema   Fetal monitoringBaseline: 125-130 bpm, Variability: Good {> 6 bpm), Accelerations: Reactive and Decelerations: 15bmp decrease in HR for 1 minute; toco shows no contractions during this time  (upon admission) Uterine activity: toco not showing contractions at this point (upon admission) Dilation: 9 Effacement (%): 100 Station: -1 Exam by:: Valentina Lucks, RN   Prenatal labs: ABO, Rh: --/--/O POS (02/09 1725)O pos Antibody: NEG (02/09 1725)neg Rubella: Immune RPR: Nonreactive (07/25 0000)  neg HBsAg: Negative (07/25 0000) neg HIV: Non-reactive (07/25 0000) NR GBS: Negative (01/19 0000) neg 1 hr Glucola: 111 Genetic screening: QUAD neg; CF neg Anatomy US nml  Prenatal Transfer Tool  Maternal Diabetes: No Genetic Screening: Normal QUAD Maternal Ultrasounds/Referrals: Normal Fetal Ultrasounds or other Referrals:  None Maternal Substance Abuse:  No Significant Maternal Medications:  None Significant Maternal Lab Results: Lab values include: Group B Strep negative  Results for orders placed or performed during the hospital encounter of 10/18/15 (from the past 24 hour(s))  CBC   Collection Time: 10/18/15  5:25 PM  Result Value Ref Range   WBC 12.3 (H) 4.0 - 10.5 K/uL   RBC 4.25 3.87 - 5.11 MIL/uL   Hemoglobin 10.4 (L) 12.0 - 15.0 g/dL   HCT 81.1 (L) 91.4 - 78.2 %   MCV 74.8 (L) 78.0 - 100.0 fL   MCH 24.5 (L) 26.0 - 34.0 pg   MCHC 32.7 30.0 - 36.0 g/dL   RDW 95.6 21.3 - 08.6 %   Platelets 312 150 - 400 K/uL  Type and screen [facility]   Collection Time: 10/18/15  5:25 PM  Result Value Ref Range   ABO/RH(D) O POS    Antibody Screen NEG    Sample Expiration 10/21/2015     Patient Active Problem List   Diagnosis Date Noted  . Indication for care in labor or delivery 10/18/2015    Assessment: Kristen Matthews is a 25 y.o. G4P1021 at [redacted]w[redacted]d here for SOL   #Labor: SOL, active labor at 9 cm  #Pain:pt desires epidural #FWB: Cat  #ID: GBS neg #MOF: breast  #MOC: Mirena #Circ: Did not discuss as patient is in active labor   Palma Holter, MD PGY 1 Family Medicine      CNM attestation:  I have seen and examined this patient; I agree with above documentation in the Resident's note.    Kristen Matthews [place], CNM 7:09 PM

## 2015-10-18 NOTE — Anesthesia Procedure Notes (Signed)
Epidural Patient location during procedure: OB  Staffing Anesthesiologist: Kamel Haven EDWARD Performed by: anesthesiologist   Preanesthetic Checklist Completed: patient identified, pre-op evaluation, timeout performed, IV checked, risks and benefits discussed and monitors and equipment checked  Epidural Patient position: sitting Prep: DuraPrep Patient monitoring: blood pressure and continuous pulse ox Approach: midline Location: L3-L4 Injection technique: LOR air  Needle:  Needle type: Tuohy  Needle gauge: 17 G Needle length: 9 cm Needle insertion depth: 5 cm Catheter size: 19 Gauge Catheter at skin depth: 10 cm Test dose: negative and Other (1% Lidocaine)  Additional Notes Patient identified.  Risk benefits discussed including failed block, incomplete pain control, headache, nerve damage, paralysis, blood pressure changes, nausea, vomiting, reactions to medication both toxic or allergic, and postpartum back pain.  Patient expressed understanding and wished to proceed.  All questions were answered.  Sterile technique used throughout procedure and epidural site dressed with sterile barrier dressing. No paresthesia or other complications noted. The patient did not experience any signs of intravascular injection such as tinnitus or metallic taste in mouth nor signs of intrathecal spread such as rapid motor block. Please see nursing notes for vital signs. Reason for block:procedure for pain   

## 2015-10-18 NOTE — MAU Note (Signed)
Pt reports having ctx q 5 min reports mucus plug out. 2cm on monday

## 2015-10-19 ENCOUNTER — Encounter (HOSPITAL_COMMUNITY): Payer: Self-pay | Admitting: *Deleted

## 2015-10-19 LAB — RPR: RPR: NONREACTIVE

## 2015-10-19 MED ORDER — DIPHENHYDRAMINE HCL 25 MG PO CAPS: 25.0000 mg | ORAL_CAPSULE | Freq: Four times a day (QID) | ORAL | Status: DC | PRN

## 2015-10-19 MED ORDER — FERROUS SULFATE 325 (65 FE) MG PO TABS
325.0000 mg | ORAL_TABLET | Freq: Two times a day (BID) | ORAL | Status: DC
  Administered 2015-10-19 – 2015-10-20 (×3): 325 mg via ORAL
  Filled 2015-10-19 (×3): qty 1

## 2015-10-19 MED ORDER — FLEET ENEMA 7-19 GM/118ML RE ENEM
1.0000 | ENEMA | Freq: Every day | RECTAL | Status: DC | PRN
Start: 2015-10-19 — End: 2015-10-20

## 2015-10-19 MED ORDER — ONDANSETRON HCL 4 MG/2ML IJ SOLN: 4.0000 mg | INTRAMUSCULAR | Status: DC | PRN

## 2015-10-19 MED ORDER — SIMETHICONE 80 MG PO CHEW: 80.0000 mg | CHEWABLE_TABLET | ORAL | Status: DC | PRN

## 2015-10-19 MED ORDER — METHYLERGONOVINE MALEATE 0.2 MG/ML IJ SOLN: 0.2000 mg | INTRAMUSCULAR | Status: DC | PRN

## 2015-10-19 MED ORDER — ZOLPIDEM TARTRATE 5 MG PO TABS: 5.0000 mg | ORAL_TABLET | Freq: Every evening | ORAL | Status: DC | PRN

## 2015-10-19 MED ORDER — ONDANSETRON HCL 4 MG PO TABS: 4.0000 mg | ORAL_TABLET | ORAL | Status: DC | PRN

## 2015-10-19 MED ORDER — DIBUCAINE 1 % RE OINT: 1.0000 "application " | TOPICAL_OINTMENT | RECTAL | Status: DC | PRN

## 2015-10-19 MED ORDER — ACETAMINOPHEN 325 MG PO TABS: 650.0000 mg | ORAL_TABLET | ORAL | Status: DC | PRN

## 2015-10-19 MED ORDER — METHYLERGONOVINE MALEATE 0.2 MG PO TABS: 0.2000 mg | ORAL_TABLET | ORAL | Status: DC | PRN

## 2015-10-19 MED ORDER — BISACODYL 10 MG RE SUPP: 10.0000 mg | Freq: Every day | RECTAL | Status: DC | PRN

## 2015-10-19 MED ORDER — SENNOSIDES-DOCUSATE SODIUM 8.6-50 MG PO TABS
2.0000 | ORAL_TABLET | ORAL | Status: DC
  Administered 2015-10-19: 2 via ORAL
  Filled 2015-10-19: qty 2

## 2015-10-19 MED ORDER — LANOLIN HYDROUS EX OINT: TOPICAL_OINTMENT | CUTANEOUS | Status: DC | PRN

## 2015-10-19 MED ORDER — PRENATAL MULTIVITAMIN CH
1.0000 | ORAL_TABLET | Freq: Every day | ORAL | Status: DC
  Administered 2015-10-19 – 2015-10-20 (×2): 1 via ORAL
  Filled 2015-10-19 (×2): qty 1

## 2015-10-19 MED ORDER — WITCH HAZEL-GLYCERIN EX PADS: 1.0000 "application " | MEDICATED_PAD | CUTANEOUS | Status: DC | PRN

## 2015-10-19 MED ORDER — MEASLES, MUMPS & RUBELLA VAC ~~LOC~~ INJ
0.5000 mL | INJECTION | Freq: Once | SUBCUTANEOUS | Status: DC
  Filled 2015-10-19: qty 0.5

## 2015-10-19 MED ORDER — IBUPROFEN 600 MG PO TABS
600.0000 mg | ORAL_TABLET | Freq: Four times a day (QID) | ORAL | Status: DC
  Administered 2015-10-19 – 2015-10-20 (×6): 600 mg via ORAL
  Filled 2015-10-19 (×7): qty 1

## 2015-10-19 MED ORDER — BENZOCAINE-MENTHOL 20-0.5 % EX AERO: 1.0000 "application " | INHALATION_SPRAY | CUTANEOUS | Status: DC | PRN

## 2015-10-19 MED ORDER — TETANUS-DIPHTH-ACELL PERTUSSIS 5-2.5-18.5 LF-MCG/0.5 IM SUSP: 0.5000 mL | Freq: Once | INTRAMUSCULAR | Status: DC

## 2015-10-19 NOTE — Progress Notes (Signed)
Post Partum Day 1 Subjective: no complaints, up ad lib, voiding and tolerating PO, small lochia, plans to breastfeed, IUD  Objective: Blood pressure 109/63, pulse 83, temperature 97.8 F (36.6 C), temperature source Tympanic, resp. rate 16, last menstrual period 01/12/2015, SpO2 99 %, unknown if currently breastfeeding.  Physical Exam:  General: alert, cooperative and no distress Lochia:normal flow Chest: CTAB Heart: RRR no m/r/g Abdomen: +BS, soft, nontender,  Uterine Fundus: firm DVT Evaluation: No evidence of DVT seen on physical exam. Extremities: no edema   Recent Labs  10/18/15 1725  HGB 10.4*  HCT 31.8*    Assessment/Plan: Plan for discharge tomorrow   LOS: 1 day   Matthews,Kristen Kief 10/19/2015, 7:21 AM

## 2015-10-19 NOTE — Anesthesia Postprocedure Evaluation (Signed)
Anesthesia Post Note  Patient: Occupational psychologist  Procedure(s) Performed: * No procedures listed *  Patient location during evaluation: Mother Baby Anesthesia Type: Epidural Level of consciousness: awake and alert Pain management: pain level controlled Vital Signs Assessment: post-procedure vital signs reviewed and stable Respiratory status: spontaneous breathing, nonlabored ventilation and respiratory function stable Cardiovascular status: stable Postop Assessment: no headache, no backache and epidural receding Anesthetic complications: no Comments: Pt stated epidural worked "great". Pain score currently 0.    Last Vitals:  Filed Vitals:   10/19/15 0139 10/19/15 0559  BP: 109/63   Pulse: 96 83  Temp: 37 C 36.6 C  Resp: 18 16    Last Pain:  Filed Vitals:   10/19/15 0605  PainSc: 4                  Deya Bigos

## 2015-10-19 NOTE — Lactation Note (Signed)
This note was copied from a baby's chart. Lactation Consultation Note  P2, Breastfed sibling for approx 2 months then states her "milk dried up". Explored reasons.  Encouraged mother to call us if she notices a reduction in her milk supply. Baby latched in cradle hold upon entering.  Encouarged mother to compress/massage breast to keep baby active. Suggest she breastfeed on both breasts w/ burping in between. Sucks and some swallows observed.  LS8. Discussed cluster feeding. Mom encouraged to feed baby 8-12 times/24 hours and with feeding cues.  Mom made aware of O/P services, breastfeeding support groups, community resources, and our phone # for post-discharge questions.    Patient Name: Kristen Matthews Today's Date: 10/19/2015 Reason for consult: Initial assessment   Maternal Data Has patient been taught Hand Expression?: Yes Does the patient have breastfeeding experience prior to this delivery?: Yes  Feeding Feeding Type: Breast Fed Length of feed: 15 min  LATCH Score/Interventions Latch: Grasps breast easily, tongue down, lips flanged, rhythmical sucking. (latched upon entrering)  Audible Swallowing: A few with stimulation  Type of Nipple: Everted at rest and after stimulation  Comfort (Breast/Nipple): Soft / non-tender     Hold (Positioning): No assistance needed to correctly position infant at breast.  LATCH Score: 9  Lactation Tools Discussed/Used     Consult Status Consult Status: PRN    Hardie Pulley 10/19/2015, 2:10 PM

## 2015-10-20 MED ORDER — IBUPROFEN 600 MG PO TABS: 600.0000 mg | ORAL_TABLET | Freq: Four times a day (QID) | ORAL | Status: DC | PRN

## 2015-10-20 NOTE — Discharge Instructions (Signed)

## 2015-10-20 NOTE — Lactation Note (Signed)
This note was copied from a baby's chart. Lactation Consultation Note  Patient Name: Kristen Matthews [id number] Date: 10/20/2015 Reason for consult: Follow-up assessment  Baby 39 hours old. Mom nursing when this [facility] entered room. Mom states that baby nursing well. Baby latched deeply, suckling rhythmically with intermittent swallows noted. Mom aware of OP/BFSG and LC phone line assistance after D/C.  Maternal Data    Feeding Feeding Type: Breast Fed  LATCH Score/Interventions                      Lactation Tools Discussed/Used     Consult Status Consult Status: PRN    Geralynn Ochs 10/20/2015, 1:10 PM

## 2015-10-20 NOTE — Discharge Summary (Signed)
OB Discharge Summary     Patient Name: Kristen Matthews DOB: 28-Jun-1991 MRN: [id number]  Date of admission: 10/18/2015 Delivering MD: Wynne Dust   Date of discharge: 10/20/2015  Admitting diagnosis: 39.6 w. ctx 5 min apart Intrauterine pregnancy: 107w6d     Secondary diagnosis:  Active Problems:   Indication for care in labor or delivery  Additional problems: None     Discharge diagnosis: Term Pregnancy Delivered                                                                                                Post partum procedures: None  Augmentation: AROM  Complications: None  Hospital course:  Onset of Labor With Vaginal Delivery     25 y.o. yo [id number] at [redacted]w[redacted]d was admitted in Active Labor on 10/18/2015. Patient had an uncomplicated labor course as follows:  Membrane Rupture Time/Date: 4:50 PM ,10/18/2015   Intrapartum Procedures: Episiotomy: None [1]                                         Lacerations:  1st degree [2];Perineal [11]  Patient had a delivery of a Viable infant. 10/18/2015  Information for the patient's newborn:  Katherleen, Folkes [[id number]]  Delivery Method: Vaginal, Spontaneous Delivery (Filed from Delivery Summary)    Pateint had an uncomplicated postpartum course.  She is ambulating, tolerating a regular diet, passing flatus, and urinating well. Patient is discharged home in stable condition on 10/20/2015.    Physical exam  Filed Vitals:   10/19/15 0139 10/19/15 0559 10/19/15 1832 10/20/15 0717  BP: 109/63  102/55 104/58  Pulse: 96 83 78 78  Temp: 98.6 F (37 C) 97.8 F (36.6 C) 98.7 F (37.1 C) 98 F (36.7 C)  TempSrc: Oral Tympanic Oral   Resp: SpO2: 97% 99%     General: alert Lochia: appropriate Uterine Fundus: firm Incision: N/A DVT Evaluation: No cords or calf tenderness. No significant calf/ankle edema. Labs: Lab Results  Component Value Date   WBC 12.3* 10/18/2015   HGB 10.4* 10/18/2015   HCT 31.8* 10/18/2015   MCV  74.8* 10/18/2015   PLT 312 10/18/2015   CMP Latest Ref Rng 03/26/2015  Glucose 65 - 99 mg/dL 132(G)  BUN 6 - 20 mg/dL 8  Creatinine 4.01 - 0.27 mg/dL 2.53  Sodium 664 - 403 mmol/L 133(L)  Potassium 3.5 - 5.1 mmol/L 3.5  Chloride 101 - 111 mmol/L 104  CO2 22 - 32 mmol/L 21(L)  Calcium 8.9 - 10.3 mg/dL 9.8  Total Protein 6.5 - 8.1 g/dL 8.0  Total Bilirubin 0.3 - 1.2 mg/dL 4.7(Q)  Alkaline Phos 38 - 126 U/L 48  AST 15 - 41 U/L 36  ALT 14 - 54 U/L 37    Discharge instruction: per After Visit Summary and "Baby and Me Booklet".  After visit meds:    Medication List    STOP taking these medications        cephALEXin 500 MG capsule  Commonly known as:  KEFLEX     docusate sodium 100 MG capsule  Commonly known as:  COLACE     famotidine 20 MG tablet  Commonly known as:  PEPCID     glycopyrrolate 2 MG tablet  Commonly known as:  ROBINUL-FORTE     polyethylene glycol powder powder  Commonly known as:  GLYCOLAX/MIRALAX     promethazine 25 MG tablet  Commonly known as:  PHENERGAN      TAKE these medications        ibuprofen 600 MG tablet  Commonly known as:  ADVIL,MOTRIN  Take 1 tablet (600 mg total) by mouth every 6 (six) hours as needed.        Diet: routine diet  Activity: Advance as tolerated. Pelvic rest for 6 weeks.   Outpatient follow up:6 weeks Follow up Appt:No future appointments. Follow up Visit:No Follow-up on file.  Postpartum contraception: IUD Mirena  Newborn Data: Live born female  Birth Weight: 7 lb (3175 g) APGAR: 9, 9  Baby Feeding: Breast Disposition:home with mother   10/20/2015 Cam Hai, CNM

## 2017-09-08 NOTE — L&D Delivery Note (Signed)
Delivery Note At 8:12 AM a viable and healthy female was delivered via Vaginal, Spontaneous (Presentation: ROA  ).  APGAR: 8, 9; weight  pending.   Placenta status: spontaneous, intact.  Cord:  with the following complications: [place] x one reduced on perineum.  Cord pH: na  Anesthesia:  local Episiotomy: None Lacerations: Vaginal Suture Repair: 3.0 vicryl rapide Est. Blood Loss (mL):  200  Mom to postpartum.  Baby to Couplet care / Skin to Skin.  Boe Deans J 06/23/2018, 8:28 AM

## 2017-12-01 ENCOUNTER — Inpatient Hospital Stay (HOSPITAL_COMMUNITY)
Admission: AD | Admit: 2017-12-01 | Discharge: 2017-12-01 | Disposition: A | Discharge status: Home / Self Care | Payer: Managed Care, Other (non HMO) | Source: Ambulatory Visit | Attending: Family Medicine | Admitting: Family Medicine

## 2017-12-01 ENCOUNTER — Encounter (HOSPITAL_COMMUNITY): Payer: Self-pay | Admitting: *Deleted

## 2017-12-01 ENCOUNTER — Inpatient Hospital Stay (HOSPITAL_COMMUNITY): Payer: Managed Care, Other (non HMO)

## 2017-12-01 DIAGNOSIS — K5900 Constipation, unspecified: Secondary | ICD-10-CM | POA: Diagnosis not present

## 2017-12-01 DIAGNOSIS — Z87891 Personal history of nicotine dependence: Secondary | ICD-10-CM | POA: Insufficient documentation

## 2017-12-01 DIAGNOSIS — O210 Mild hyperemesis gravidarum: Secondary | ICD-10-CM | POA: Diagnosis not present

## 2017-12-01 DIAGNOSIS — Z3A10 10 weeks gestation of pregnancy: Secondary | ICD-10-CM | POA: Insufficient documentation

## 2017-12-01 DIAGNOSIS — O99611 Diseases of the digestive system complicating pregnancy, first trimester: Secondary | ICD-10-CM

## 2017-12-01 DIAGNOSIS — O219 Vomiting of pregnancy, unspecified: Secondary | ICD-10-CM

## 2017-12-01 DIAGNOSIS — O26891 Other specified pregnancy related conditions, first trimester: Secondary | ICD-10-CM | POA: Diagnosis not present

## 2017-12-01 LAB — COMPREHENSIVE METABOLIC PANEL
ALBUMIN: 4.3 g/dL (ref 3.5–5.0)
ALT: 34 U/L (ref 14–54)
AST: 32 U/L (ref 15–41)
Alkaline Phosphatase: 50 U/L (ref 38–126)
Anion gap: 14 (ref 5–15)
BILIRUBIN TOTAL: 1.5 mg/dL — AB (ref 0.3–1.2)
BUN: 5 mg/dL — AB (ref 6–20)
CHLORIDE: 96 mmol/L — AB (ref 101–111)
CO2: 23 mmol/L (ref 22–32)
CREATININE: 0.71 mg/dL (ref 0.44–1.00)
Calcium: 9.9 mg/dL (ref 8.9–10.3)
GFR calc Af Amer: >60 mL/min (ref >60)
GFR calc non Af Amer: >60 mL/min (ref >60)
GLUCOSE: 91 mg/dL (ref 65–99)
POTASSIUM: 3.3 mmol/L — AB (ref 3.5–5.1)
Sodium: 133 mmol/L — ABNORMAL LOW (ref 135–145)
Total Protein: 9.2 g/dL — ABNORMAL HIGH (ref 6.5–8.1)

## 2017-12-01 LAB — URINALYSIS, ROUTINE W REFLEX MICROSCOPIC
Glucose, UA: NEGATIVE mg/dL
HGB URINE DIPSTICK: NEGATIVE
Ketones, ur: 80 mg/dL — AB
NITRITE: NEGATIVE
PH: 6 (ref 5.0–8.0)
Protein, ur: 100 mg/dL — AB
SPECIFIC GRAVITY, URINE: 1.033 — AB (ref 1.005–1.030)

## 2017-12-01 LAB — CBC
HEMATOCRIT: 40 % (ref 36.0–46.0)
Hemoglobin: 14.6 g/dL (ref 12.0–15.0)
MCH: 28.4 pg (ref 26.0–34.0)
MCHC: 36.5 g/dL — AB (ref 30.0–36.0)
MCV: 77.8 fL — AB (ref 78.0–100.0)
PLATELETS: 271 10*3/uL (ref 150–400)
RBC: 5.14 MIL/uL — ABNORMAL HIGH (ref 3.87–5.11)
RDW: 14.5 % (ref 11.5–15.5)
WBC: 8.5 10*3/uL (ref 4.0–10.5)

## 2017-12-01 LAB — POCT PREGNANCY, URINE: Preg Test, Ur: POSITIVE — AB

## 2017-12-01 LAB — HCG, QUANTITATIVE, PREGNANCY: HCG, BETA CHAIN, QUANT, S: 86482 m[IU]/mL — AB (ref <5)

## 2017-12-01 MED ORDER — LACTATED RINGERS IV SOLN
25.0000 mg | Freq: Once | INTRAVENOUS | Status: AC
  Administered 2017-12-01: 25 mg via INTRAVENOUS
  Filled 2017-12-01: qty 1

## 2017-12-01 MED ORDER — DEXTROSE 5 % IN LACTATED RINGERS IV BOLUS
1000.0000 mL | Freq: Once | INTRAVENOUS | Status: AC
Start: 2017-12-01 — End: 2017-12-01
  Administered 2017-12-01: 1000 mL via INTRAVENOUS

## 2017-12-01 MED ORDER — GLYCOPYRROLATE 1 MG PO TABS
1.0000 mg | ORAL_TABLET | Freq: Once | ORAL | Status: AC
  Administered 2017-12-01: 1 mg via ORAL
  Filled 2017-12-01: qty 1

## 2017-12-01 MED ORDER — PROMETHAZINE HCL 25 MG PO TABS: 25.0000 mg | ORAL_TABLET | Freq: Four times a day (QID) | ORAL | 2 refills | Status: DC | PRN

## 2017-12-01 MED ORDER — GLYCOPYRROLATE 1 MG PO TABS: 1.0000 mg | ORAL_TABLET | Freq: Three times a day (TID) | ORAL | 1 refills | Status: DC

## 2017-12-01 NOTE — Discharge Instructions (Signed)
Constipation, Adult Constipation is when a person has fewer bowel movements in a week than normal, has difficulty having a bowel movement, or has stools that are dry, hard, or larger than normal. Constipation may be caused by an underlying condition. It may become worse with age if a person takes certain medicines and does not take in enough fluids. Follow these instructions at home: Eating and drinking   Eat foods that have a lot of fiber, such as fresh fruits and vegetables, whole grains, and beans.  Limit foods that are high in fat, low in fiber, or overly processed, such as french fries, hamburgers, cookies, candies, and soda.  Drink enough fluid to keep your urine clear or pale yellow. General instructions  Exercise regularly or as told by your health care provider.  Go to the restroom when you have the urge to go. Do not hold it in.  Take over-the-counter and prescription medicines only as told by your health care provider. These include any fiber supplements.  Practice pelvic floor retraining exercises, such as deep breathing while relaxing the lower abdomen and pelvic floor relaxation during bowel movements.  Watch your condition for any changes.  Keep all follow-up visits as told by your health care provider. This is important. Contact a health care provider if:  You have pain that gets worse.  You have a fever.  You do not have a bowel movement after 4 days.  You vomit.  You are not hungry.  You lose weight.  You are bleeding from the anus.  You have thin, pencil-like stools. Get help right away if:  You have a fever and your symptoms suddenly get worse.  You leak stool or have blood in your stool.  Your abdomen is bloated.  You have severe pain in your abdomen.  You feel dizzy or you faint. This information is not intended to replace advice given to you by your health care provider. Make sure you discuss any questions you have with your health care  provider. Document Released: 05/23/2004 Document Revised: 03/14/2016 Document Reviewed: 02/13/2016 Elsevier Interactive Patient Education  2018 [employer]  Safe Medications in Pregnancy   Acne: Benzoyl Peroxide Salicylic Acid  Backache/Headache: Tylenol: 2 regular strength every 4 hours OR              2 Extra strength every 6 hours  Colds/Coughs/Allergies: Benadryl (alcohol free) 25 mg every 6 hours as needed Breath right strips Claritin Cepacol throat lozenges Chloraseptic throat spray Cold-Eeze- up to three times per day Cough drops, alcohol free Flonase (by prescription only) Guaifenesin Mucinex Robitussin DM (plain only, alcohol free) Saline nasal spray/drops Sudafed (pseudoephedrine) & Actifed ** use only after [redacted] weeks gestation and if you do not have high blood pressure Tylenol Vicks Vaporub Zinc lozenges Zyrtec   Constipation: Colace Ducolax suppositories Fleet enema Glycerin suppositories Metamucil Milk of magnesia Miralax Senokot Smooth move tea  Diarrhea: Kaopectate Imodium A-D  *NO pepto Bismol  Hemorrhoids: Anusol Anusol HC Preparation H Tucks  Indigestion: Tums Maalox Mylanta Zantac  Pepcid  Insomnia: Benadryl (alcohol free) 25mg  every 6 hours as needed Tylenol PM Unisom, no Gelcaps  Leg Cramps: Tums MagGel  Nausea/Vomiting:  Bonine Dramamine Emetrol Ginger extract Sea bands Meclizine  Nausea medication to take during pregnancy:  Unisom (doxylamine succinate 25 mg tablets) Take one tablet daily at bedtime. If symptoms are not adequately controlled, the dose can be increased to a maximum recommended dose of two tablets daily (1/2 tablet in the morning, 1/2  tablet mid-afternoon and one at bedtime). Vitamin B6 100mg  tablets. Take one tablet twice a day (up to 200 mg per day).  Skin Rashes: Aveeno products Benadryl cream or 25mg  every 6 hours as needed Calamine Lotion 1% cortisone cream  Yeast  infection: Gyne-lotrimin 7 Monistat 7   **If taking multiple medications, please check labels to avoid duplicating the same active ingredients **take medication as directed on the label ** Do not exceed 4000 mg of tylenol in 24 hours **Do not take medications that contain aspirin or ibuprofen

## 2017-12-01 NOTE — MAU Provider Note (Addendum)
History     CSN: [id number]  Arrival date and time: 12/01/17 1552   First Provider Initiated Contact with Patient 12/01/17 1947      Chief Complaint  Patient presents with  . Emesis   Kristen Matthews is a 27 y.o. [id number] at 7973w3d who presents today with nausea and vomiting x 3 weeks. She had been taking zofran, but ran out. She states that she is vomiting 5 or more times per day. She also has a lot spit. [facility]   Emesis   This is a new problem. Episode onset: 3 weeks ago. The problem occurs 5 to 10 times per day. The problem has been unchanged. The emesis has an appearance of stomach contents. There has been no fever. Pertinent negatives include no chills or fever. Risk factors: pregnancy  Treatments tried: zofran ODT, diclegis  Improvement on treatment: zofran helped a little bit. Diclegis has not helped much.    History reviewed. No pertinent past medical history.  Past Surgical History:  Procedure Laterality Date  . INDUCED ABORTION    . NO PAST SURGERIES      Family History  Problem Relation Age of Onset  . Diabetes Maternal Aunt   . Diabetes Maternal Uncle   . Heart disease Maternal Uncle   . Hypertension Maternal Grandfather   . Diabetes Maternal Grandfather   . Heart disease Maternal Grandfather   . COPD Maternal Grandfather   . Diabetes Paternal Grandmother     Social History   Tobacco Use  . Smoking status: Former Games developermoker  . Smokeless tobacco: Never Used  Substance Use Topics  . Alcohol use: No    Comment: socially  . Drug use: No    Allergies:  Allergies  Allergen Reactions  . Peanut-Containing Drug Products Hives and Swelling    Medications Prior to Admission  Medication Sig Dispense Refill Last Dose  . doxylamine, Sleep, (UNISOM) 25 MG tablet Take 25 mg by mouth at bedtime as needed for sleep.   11/30/2017 at Unknown time  . Prenatal Vit-Fe Fumarate-FA (PRENATAL MULTIVITAMIN) TABS tablet Take 1 tablet by mouth daily  at 12 noon.   12/01/2017 at Unknown time  . pseudoephedrine-acetaminophen (TYLENOL SINUS) 30-500 MG TABS tablet Take 1 tablet by mouth every 4 (four) hours as needed (sinus congestion).   12/01/2017 at Unknown time  . pyridoxine (B-6) 100 MG tablet Take 100 mg by mouth at bedtime.   11/30/2017 at Unknown time    Review of Systems  Constitutional: Negative for chills and fever.  Gastrointestinal: Positive for constipation, nausea and vomiting.  Genitourinary: Negative for dysuria, frequency, pelvic pain, vaginal bleeding and vaginal discharge.   Physical Exam   Blood pressure 119/74, pulse (!) 102, temperature 97.7 F (36.5 C), temperature source Oral, resp. rate 16, height 5\' 4"  (1.626 m), weight 86.6 kg (191 lb), last menstrual period 09/19/2017, unknown if currently breastfeeding.  Physical Exam  Nursing note and vitals reviewed. Constitutional: She is oriented to person, place, and time. She appears well-developed and well-nourished. No distress.  HENT:  Head: Normocephalic.  Cardiovascular: Normal rate.  Respiratory: Effort normal.  GI: Soft. There is no tenderness. There is no rebound.  Genitourinary:  Genitourinary Comments: FHT 160 with doppler   Neurological: She is alert and oriented to person, place, and time.  Skin: Skin is warm and dry.  Psychiatric: She has a normal mood and affect.    Results for orders placed or performed during the hospital encounter  of 12/01/17 (from the past 24 hour(s))  Urinalysis, Routine w reflex microscopic     Status: Abnormal   Collection Time: 12/01/17  4:00 PM  Result Value Ref Range   Color, Urine Kristen Matthews (A) YELLOW   APPearance CLOUDY (A) CLEAR   Specific Gravity, Urine 1.033 (H) 1.005 - 1.030   pH 6.0 5.0 - 8.0   Glucose, UA NEGATIVE NEGATIVE mg/dL   Hgb urine dipstick NEGATIVE NEGATIVE   Bilirubin Urine MODERATE (A) NEGATIVE   Ketones, ur 80 (A) NEGATIVE mg/dL   Protein, ur 536 (A) NEGATIVE mg/dL   Nitrite NEGATIVE NEGATIVE    Leukocytes, UA MODERATE (A) NEGATIVE   RBC / HPF 0-5 0 - 5 RBC/hpf   WBC, UA 6-30 0 - 5 WBC/hpf   Bacteria, UA MANY (A) NONE SEEN   Squamous Epithelial / LPF 6-30 (A) NONE SEEN   Mucus PRESENT   Pregnancy, urine POC     Status: Abnormal   Collection Time: 12/01/17  4:36 PM  Result Value Ref Range   Preg Test, Ur POSITIVE (A) NEGATIVE  hCG, quantitative, pregnancy     Status: Abnormal   Collection Time: 12/01/17  5:39 PM  Result Value Ref Range   hCG, Beta Chain, Quant, S [phone] (H) <5 mIU/mL  Comprehensive metabolic panel     Status: Abnormal   Collection Time: 12/01/17  5:39 PM  Result Value Ref Range   Sodium 133 (L) 135 - 145 mmol/L   Potassium 3.3 (L) 3.5 - 5.1 mmol/L   Chloride 96 (L) 101 - 111 mmol/L   CO2 23 22 - 32 mmol/L   Glucose, Bld 91 65 - 99 mg/dL   BUN 5 (L) 6 - 20 mg/dL   Creatinine, Ser 4.74 0.44 - 1.00 mg/dL   Calcium 9.9 8.9 - 25.9 mg/dL   Total Protein 9.2 (H) 6.5 - 8.1 g/dL   Albumin 4.3 3.5 - 5.0 g/dL   AST 32 15 - 41 U/L   ALT 34 14 - 54 U/L   Alkaline Phosphatase 50 38 - 126 U/L   Total Bilirubin 1.5 (H) 0.3 - 1.2 mg/dL   GFR calc non Af Amer >60 >60 mL/min   GFR calc Af Amer >60 >60 mL/min   Anion gap 14 5 - 15  CBC     Status: Abnormal   Collection Time: 12/01/17  5:39 PM  Result Value Ref Range   WBC 8.5 4.0 - 10.5 K/uL   RBC 5.14 (H) 3.87 - 5.11 MIL/uL   Hemoglobin 14.6 12.0 - 15.0 g/dL   HCT 56.3 87.5 - 64.3 %   MCV 77.8 (L) 78.0 - 100.0 fL   MCH 28.4 26.0 - 34.0 pg   MCHC 36.5 (H) 30.0 - 36.0 g/dL   RDW 32.9 51.8 - 84.1 %   Platelets 271 150 - 400 K/uL   Dg Abdomen Acute W/chest  Result Date: 12/01/2017 CLINICAL DATA:  27 year old female with upper abdominal pain, low back pain and foul smelling emesis since home enema last night. EXAM: DG ABDOMEN ACUTE W/ 1V CHEST COMPARISON:  Ob ultrasound 09/07/2015 FINDINGS: Normal lung volumes. Normal cardiac size and mediastinal contours. Visualized tracheal air column is within normal limits. The  lungs are clear. No pneumothorax or pneumoperitoneum. Upright and supine views of the abdomen. Non obstructed bowel gas pattern. Only mild volume of retained stool in the colon at the hepatic flexure. Abdominal and pelvic visceral contours appear normal. No osseous abnormality identified. IMPRESSION: 1.  Normal bowel gas  pattern, no free air. 2. Negative chest. Electronically Signed   By: Odessa Fleming M.D.   On: 12/01/2017 19:06   MAU Course  Procedures  MDM D5LR LR with phenergan  Robinul 1mg   2105: Care turned over to [facility], CNM. 2nd bag of fluids infusing  Thressa Sheller 9:05 PM 12/01/17   Patient reports relief with phenergan. Able to tolerate PO fluids.  Assessment and Plan   1. Nausea and vomiting during pregnancy prior to [redacted] weeks gestation   2. Constipation during pregnancy in first trimester    -Discharge home in stable condition -Rx for robinul and phenergan given to patient -Constipation precautions discussed -Patient advised to follow-up with OB/GYN for prenatal care ASAP -Patient may return to MAU as needed or if her condition were to change or worsen  Rolm Bookbinder, CNM 12/01/17 9:21 PM

## 2017-12-01 NOTE — MAU Note (Signed)
Pt C/O constipation, husband gave her an enema last night.  Has had ongoing vomiting, but emesis looks like stool since this morning.  Is worried it may be related to the enema, also smells like stool.  Has occasional upper abdominal & lower back pain.  Denies vaginal bleeding.  Pos HPT over a month ago.

## 2017-12-26 ENCOUNTER — Other Ambulatory Visit: Payer: Self-pay

## 2017-12-26 ENCOUNTER — Observation Stay (HOSPITAL_COMMUNITY)
Admission: AD | Admit: 2017-12-26 | Discharge: 2017-12-29 | DRG: 831 | Disposition: A | Discharge status: Home / Self Care | Payer: Managed Care, Other (non HMO) | Source: Ambulatory Visit | Attending: Obstetrics and Gynecology | Admitting: Obstetrics and Gynecology

## 2017-12-26 ENCOUNTER — Encounter (HOSPITAL_COMMUNITY): Payer: Self-pay | Admitting: *Deleted

## 2017-12-26 DIAGNOSIS — O99612 Diseases of the digestive system complicating pregnancy, second trimester: Secondary | ICD-10-CM | POA: Diagnosis not present

## 2017-12-26 DIAGNOSIS — Z87891 Personal history of nicotine dependence: Secondary | ICD-10-CM | POA: Diagnosis not present

## 2017-12-26 DIAGNOSIS — K219 Gastro-esophageal reflux disease without esophagitis: Secondary | ICD-10-CM | POA: Diagnosis not present

## 2017-12-26 DIAGNOSIS — R945 Abnormal results of liver function studies: Secondary | ICD-10-CM

## 2017-12-26 DIAGNOSIS — K117 Disturbances of salivary secretion: Secondary | ICD-10-CM | POA: Diagnosis not present

## 2017-12-26 DIAGNOSIS — Z3A14 14 weeks gestation of pregnancy: Secondary | ICD-10-CM

## 2017-12-26 DIAGNOSIS — E860 Dehydration: Secondary | ICD-10-CM

## 2017-12-26 DIAGNOSIS — R7989 Other specified abnormal findings of blood chemistry: Secondary | ICD-10-CM

## 2017-12-26 DIAGNOSIS — K5900 Constipation, unspecified: Secondary | ICD-10-CM | POA: Diagnosis present

## 2017-12-26 DIAGNOSIS — K859 Acute pancreatitis without necrosis or infection, unspecified: Secondary | ICD-10-CM | POA: Diagnosis not present

## 2017-12-26 DIAGNOSIS — O26892 Other specified pregnancy related conditions, second trimester: Secondary | ICD-10-CM | POA: Diagnosis not present

## 2017-12-26 DIAGNOSIS — R933 Abnormal findings on diagnostic imaging of other parts of digestive tract: Secondary | ICD-10-CM

## 2017-12-26 DIAGNOSIS — R1110 Vomiting, unspecified: Secondary | ICD-10-CM

## 2017-12-26 DIAGNOSIS — O211 Hyperemesis gravidarum with metabolic disturbance: Secondary | ICD-10-CM | POA: Diagnosis not present

## 2017-12-26 DIAGNOSIS — R748 Abnormal levels of other serum enzymes: Secondary | ICD-10-CM | POA: Diagnosis present

## 2017-12-26 LAB — URINALYSIS, ROUTINE W REFLEX MICROSCOPIC
Cellular Cast, UA: 11
Glucose, UA: NEGATIVE mg/dL
Hgb urine dipstick: NEGATIVE
Ketones, ur: 80 mg/dL — AB
Nitrite: NEGATIVE
Protein, ur: 100 mg/dL — AB
Specific Gravity, Urine: 1.031 — ABNORMAL HIGH (ref 1.005–1.030)
Trans Epithel, UA: 1
pH: 5 (ref 5.0–8.0)

## 2017-12-26 LAB — CBC WITH DIFFERENTIAL/PLATELET
Basophils Absolute: 0 10*3/uL (ref 0.0–0.1)
Basophils Relative: 0 %
Eosinophils Absolute: 0 10*3/uL (ref 0.0–0.7)
Eosinophils Relative: 0 %
HCT: 39.6 % (ref 36.0–46.0)
Hemoglobin: 14.8 g/dL (ref 12.0–15.0)
Lymphocytes Relative: 15 %
Lymphs Abs: 1.6 10*3/uL (ref 0.7–4.0)
MCH: 28.4 pg (ref 26.0–34.0)
MCHC: 37.4 g/dL — ABNORMAL HIGH (ref 30.0–36.0)
MCV: 75.9 fL — ABNORMAL LOW (ref 78.0–100.0)
Monocytes Absolute: 1 10*3/uL (ref 0.1–1.0)
Monocytes Relative: 9 %
Neutro Abs: 8.1 10*3/uL — ABNORMAL HIGH (ref 1.7–7.7)
Neutrophils Relative %: 76 %
Platelets: 260 10*3/uL (ref 150–400)
RBC: 5.22 MIL/uL — ABNORMAL HIGH (ref 3.87–5.11)
RDW: 14.3 % (ref 11.5–15.5)
WBC: 10.7 10*3/uL — ABNORMAL HIGH (ref 4.0–10.5)

## 2017-12-26 LAB — COMPREHENSIVE METABOLIC PANEL
ALT: 148 U/L — ABNORMAL HIGH (ref 14–54)
AST: 92 U/L — ABNORMAL HIGH (ref 15–41)
Albumin: 4.1 g/dL (ref 3.5–5.0)
Alkaline Phosphatase: 58 U/L (ref 38–126)
Anion gap: 18 — ABNORMAL HIGH (ref 5–15)
BUN: 11 mg/dL (ref 6–20)
CO2: 18 mmol/L — ABNORMAL LOW (ref 22–32)
Calcium: 10.7 mg/dL — ABNORMAL HIGH (ref 8.9–10.3)
Chloride: 98 mmol/L — ABNORMAL LOW (ref 101–111)
Creatinine, Ser: 0.81 mg/dL (ref 0.44–1.00)
GFR calc Af Amer: >60 mL/min (ref >60)
GFR calc non Af Amer: >60 mL/min (ref >60)
Glucose, Bld: 107 mg/dL — ABNORMAL HIGH (ref 65–99)
Potassium: 3.4 mmol/L — ABNORMAL LOW (ref 3.5–5.1)
Sodium: 134 mmol/L — ABNORMAL LOW (ref 135–145)
Total Bilirubin: 2.5 mg/dL — ABNORMAL HIGH (ref 0.3–1.2)
Total Protein: 9 g/dL — ABNORMAL HIGH (ref 6.5–8.1)

## 2017-12-26 LAB — AMYLASE: Amylase: 176 U/L — ABNORMAL HIGH (ref 28–100)

## 2017-12-26 LAB — LIPASE, BLOOD: Lipase: 40 U/L (ref 11–51)

## 2017-12-26 MED ORDER — MAGNESIUM OXIDE 400 (241.3 MG) MG PO TABS
400.0000 mg | ORAL_TABLET | Freq: Every day | ORAL | Status: DC
  Administered 2017-12-26: 400 mg via ORAL
  Filled 2017-12-26 (×2): qty 1

## 2017-12-26 MED ORDER — MAGNESIUM OXIDE 400 (241.3 MG) MG PO TABS
400.0000 mg | ORAL_TABLET | Freq: Every day | ORAL | Status: DC
  Filled 2017-12-26: qty 1

## 2017-12-26 MED ORDER — LACTATED RINGERS IV BOLUS
1000.0000 mL | Freq: Once | INTRAVENOUS | Status: AC
  Administered 2017-12-26: 1000 mL via INTRAVENOUS

## 2017-12-26 MED ORDER — MECLIZINE HCL 25 MG PO TABS
25.0000 mg | ORAL_TABLET | Freq: Once | ORAL | Status: AC
  Administered 2017-12-26: 25 mg via ORAL
  Filled 2017-12-26: qty 1

## 2017-12-26 MED ORDER — MAGNESIUM OXIDE 400 (241.3 MG) MG PO TABS: 400.0000 mg | ORAL_TABLET | Freq: Every day | ORAL | 4 refills | Status: DC

## 2017-12-26 MED ORDER — DEXTROSE IN LACTATED RINGERS 5 % IV SOLN: INTRAVENOUS | Status: DC

## 2017-12-26 MED ORDER — FAMOTIDINE IN NACL 20-0.9 MG/50ML-% IV SOLN
20.0000 mg | Freq: Once | INTRAVENOUS | Status: AC
  Administered 2017-12-26: 20 mg via INTRAVENOUS
  Filled 2017-12-26: qty 50

## 2017-12-26 MED ORDER — LACTATED RINGERS IV SOLN
INTRAVENOUS | Status: DC
  Administered 2017-12-26 – 2017-12-27 (×2): via INTRAVENOUS

## 2017-12-26 MED ORDER — RANITIDINE HCL 300 MG PO TABS: 300.0000 mg | ORAL_TABLET | Freq: Every day | ORAL | 0 refills | Status: DC

## 2017-12-26 MED ORDER — ONDANSETRON HCL 40 MG/20ML IJ SOLN
8.0000 mg | Freq: Once | INTRAMUSCULAR | Status: AC
  Administered 2017-12-26: 8 mg via INTRAVENOUS
  Filled 2017-12-26: qty 4

## 2017-12-26 NOTE — MAU Note (Signed)
Pt reports n/v since the beginning of pregnancy. Vomiting had been really bad for the last couple of days. Pt states that she is unable to give urine sample @ this time. She used the bathroom before she came to the hospital and reports that her urine is very dark.Pt took 4mg  Zofran last around 4-5 PM. Robinul and phenergan last night. Pt reports constipation also.

## 2017-12-26 NOTE — H&P (Signed)
  OB ADMISSION/ HISTORY & PHYSICAL:  Admission Date: 12/26/2017  8:02 PM  Admit Diagnosis: 14 weeks / dehydration from vomiting / elevated liver enzymes  Kristen Matthews is a 27 y.o. female presenting for vomiting with dehydration, back and rib pain, constipation.      Medical / Surgical History :  Past medical history: History reviewed. No pertinent past medical history.   Past surgical history:  Past Surgical History:  Procedure Laterality Date  . INDUCED ABORTION    . NO PAST SURGERIES      Family History:  Family History  Problem Relation Age of Onset  . Diabetes Maternal Aunt   . Diabetes Maternal Uncle   . Heart disease Maternal Uncle   . Hypertension Maternal Grandfather   . Diabetes Maternal Grandfather   . Heart disease Maternal Grandfather   . COPD Maternal Grandfather   . Diabetes Paternal Grandmother      Social History:  reports that she has quit smoking. She has never used smokeless tobacco. She reports that she does not drink alcohol or use drugs.   Allergies: Peanut-containing drug products    Current Medications at time of admission:  Prior to Admission medications   Medication Sig Start Date End Date Taking? Authorizing Provider  calcium carbonate (TUMS - DOSED IN MG ELEMENTAL CALCIUM) 500 MG chewable tablet Chew 1 tablet by mouth daily.   Yes [provider]  doxylamine, Sleep, (UNISOM) 25 MG tablet Take 25 mg by mouth at bedtime as needed for sleep.   Yes [provider]  glycopyrrolate (ROBINUL) 1 MG tablet Take 1 tablet (1 mg total) by mouth 3 (three) times daily. 12/01/17 12/31/17 Yes Rolm BookbinderNeill, Caroline M, CNM  polyethylene glycol (MIRALAX / GLYCOLAX) packet Take 17 g by mouth daily.   Yes [provider]  Prenatal Vit-Fe Fumarate-FA (PRENATAL MULTIVITAMIN) TABS tablet Take 1 tablet by mouth daily at 12 noon.   Yes [provider]  promethazine (PHENERGAN) 25 MG tablet Take 1 tablet (25 mg total) by mouth every 6 (six)  hours as needed for nausea or vomiting. 12/01/17  Yes Rolm BookbinderNeill, Caroline M, CNM  pseudoephedrine-acetaminophen (TYLENOL SINUS) 30-500 MG TABS tablet Take 1 tablet by mouth every 4 (four) hours as needed (sinus congestion).   Yes [provider]  pyridoxine (B-6) 100 MG tablet Take 100 mg by mouth at bedtime.   Yes [provider]   Review of Systems: Nausea and vomiting x 24 hours - unable to keep anything down Constipation Spitting with excessive saliva in pregnancy Back and rib pain all day   Physical Exam: VS: Blood pressure 100/75, pulse 97, temperature 97.6 F (36.4 C), temperature source Oral, resp. rate 20, weight 78.5 kg (173 lb), last menstrual period 09/19/2017, SpO2 100 %, unknown if currently breastfeeding.  General: alert and oriented, appears ill with general malaise Heart: RRR Lungs: Clear lung fields Abdomen: Gravid, soft and non-tender, non-distended  Extremities: no edema  FHR:   Assessment: Dehydration from vomiting GERD Ptyalism Elevated Liver Enzymes - possible gallbladder etiology   Plan:  Admit Supportive care NPO for gallbladder sono in AM Check amylase and lipase / CBC with diff   Dr Billy Coastaavon notified of admission / plan of care   Marlinda Mikeanya Yoon Barca CNM, MSN, [facility] 12/26/2017, 10:43 PM

## 2017-12-26 NOTE — Progress Notes (Signed)
S:  reports nausea and vomiting x 24 hour and unable to keep fluid down       early pregnancy N&V but better for awhile until yesterday       significant GERD - acid reflux with sore throat after vomiting       reports a lot of spitting contributing to nausea       Zofran around 4pm - not effective       some constipation - Miralax today  O:  VS: BP 100/75 (BP Location: Right Arm)   Pulse 97   Temp 97.6 F (36.4 C) (Oral)   Resp 20   Wt 78.5 kg (173 lb)   LMP 09/19/2017   SpO2 100%   BMI 29.70 kg/m    Alert and oriented - general malaise backache and rib pain from vomiting all day - difficulty to lie down negative CVA abdomen soft and non-tender   Results for Kristen Matthews, Kristen (MRN [id number]) as of 12/26/2017 21:53  Ref. Range 12/26/2017 20:49  URINALYSIS, ROUTINE W REFLEX MICROSCOPIC Unknown Rpt (A)  Appearance Latest Ref Range: CLEAR  TURBID (A)  Bilirubin Urine Latest Ref Range: NEGATIVE  MODERATE (A)  Color, Urine Latest Ref Range: YELLOW  [name] (A)  Glucose Latest Ref Range: NEGATIVE mg/dL NEGATIVE  Hgb urine dipstick Latest Ref Range: NEGATIVE  NEGATIVE  Ketones, ur Latest Ref Range: NEGATIVE mg/dL 80 (A)  Leukocytes, UA Latest Ref Range: NEGATIVE  TRACE (A)  Nitrite Latest Ref Range: NEGATIVE  NEGATIVE  pH Latest Ref Range: 5.0 - 8.0  5.0  Protein Latest Ref Range: NEGATIVE mg/dL 119100 (A)  Specific Gravity, Urine Latest Ref Range: 1.005 - 1.030  1.031 (H)      A: 14 weeks dehydration from vomiting             - etiology differential: pregnancy, ptyalism, viral illness, gallbladder      GERD      Constipation      Elevated LE  P:       IV fluid hydration x 2 liters initially LR and DRLR                         - will continue to alternat each until tolerating PO intake      Pepcid IV now and every 12 hours the start Zantac 300mg  daily tomorrow after PO intake      magnesium oxide 400mg  daily - start dose here tonight      meclizine prior to discharge tonight to start  daily chewable Bonine for nausea                         - possible ptyalism to help reduce oral secretions / stop Robinol      Phenergan IV for vomiting - avoid further zofran if possible due to constipation side effects      Nubain 10 IV for pain       Admit - NPO - gallbladder sono in AM       Check amylase and lipase / add CBCD    Marlinda Mikeanya Heyden Jaber CNM, MSN, [facility] 12/26/2017, 9:53 PM

## 2017-12-26 NOTE — Progress Notes (Signed)
  PLAN of CARE:  NPO until ultrasound completed tomorrow   VS with FHR every shift  Bed rest with BRP  IV fluids - alternate 1000ml LR and DRLR at 200 ml/hr through the night then decrease to 125/hr tomorrow after ultrasound.  Labs - CBCD, amylase, lipase added at admission  Medications: Given in MAU - Zofran 8mg  IV / Pepcid 20mg  IV / Magnesium 400mg  PO / Meclizine 25mg   On-going medications though the night: Nubain 10 IV PRN pain - give initial dose after admission completed on floor Pepcid 20mg  IV Q 12 hour Phenergan 25mg  IV with dilution every 6 hours for refractory vomiting  Marlinda Mikeanya Bailey CNM [facility]

## 2017-12-26 NOTE — Discharge Instructions (Signed)
Appointment Monday morning at 9am    HOME CARE:  Rest at home x 24 hours  Avoid fried foods and dairy x 48 hours  Eat light bland foods (oatmeal, grits, toast, bananas, grapes, applesauce, chicken broth, crackers, rice)  Do not go longer than 4 hours without something light to eat / drink  Medications: Unisom and B6 daily Zantac 300mg  daily Magnesium 400mg  daily Bonine (chewable) every 8 hours (three times daily) Phenergan for nausea   Zofran ONLY if vomiting > 4 hours   STOP - TUMS, Sudafed, Robinol

## 2017-12-27 ENCOUNTER — Inpatient Hospital Stay (HOSPITAL_COMMUNITY): Payer: Managed Care, Other (non HMO)

## 2017-12-27 DIAGNOSIS — R748 Abnormal levels of other serum enzymes: Secondary | ICD-10-CM | POA: Diagnosis present

## 2017-12-27 LAB — COMPREHENSIVE METABOLIC PANEL
ALT: 101 U/L — ABNORMAL HIGH (ref 14–54)
AST: 58 U/L — ABNORMAL HIGH (ref 15–41)
Albumin: 3 g/dL — ABNORMAL LOW (ref 3.5–5.0)
Alkaline Phosphatase: 40 U/L (ref 38–126)
Anion gap: 10 (ref 5–15)
BUN: 6 mg/dL (ref 6–20)
CO2: 22 mmol/L (ref 22–32)
Calcium: 9 mg/dL (ref 8.9–10.3)
Chloride: 103 mmol/L (ref 101–111)
Creatinine, Ser: 0.59 mg/dL (ref 0.44–1.00)
GFR calc Af Amer: >60 mL/min (ref >60)
GFR calc non Af Amer: >60 mL/min (ref >60)
Glucose, Bld: 102 mg/dL — ABNORMAL HIGH (ref 65–99)
Potassium: 2.6 mmol/L — CL (ref 3.5–5.1)
Sodium: 135 mmol/L (ref 135–145)
Total Bilirubin: 1.9 mg/dL — ABNORMAL HIGH (ref 0.3–1.2)
Total Protein: 6.9 g/dL (ref 6.5–8.1)

## 2017-12-27 LAB — LIPASE, BLOOD: Lipase: 44 U/L (ref 11–51)

## 2017-12-27 LAB — URINALYSIS, DIPSTICK ONLY
Bilirubin Urine: NEGATIVE
Glucose, UA: NEGATIVE mg/dL
Hgb urine dipstick: NEGATIVE
Ketones, ur: 20 mg/dL — AB
Leukocytes, UA: NEGATIVE
Nitrite: NEGATIVE
Protein, ur: NEGATIVE mg/dL
Specific Gravity, Urine: 1.012 (ref 1.005–1.030)
pH: 6 (ref 5.0–8.0)

## 2017-12-27 LAB — AMYLASE: Amylase: 167 U/L — ABNORMAL HIGH (ref 28–100)

## 2017-12-27 MED ORDER — DEXTROSE IN LACTATED RINGERS 5 % IV SOLN
INTRAVENOUS | Status: DC
  Administered 2017-12-27: 01:00:00 via INTRAVENOUS

## 2017-12-27 MED ORDER — KCL IN DEXTROSE-NACL 20-5-0.45 MEQ/L-%-% IV SOLN
INTRAVENOUS | Status: DC
  Administered 2017-12-27 – 2017-12-28 (×3): via INTRAVENOUS
  Filled 2017-12-27 (×5): qty 1000

## 2017-12-27 MED ORDER — DEXTROSE-NACL 5-0.45 % IV SOLN
INTRAVENOUS | Status: DC
  Administered 2017-12-27: 10:00:00 via INTRAVENOUS

## 2017-12-27 MED ORDER — BISACODYL 10 MG RE SUPP
10.0000 mg | Freq: Two times a day (BID) | RECTAL | Status: DC
  Administered 2017-12-27 – 2017-12-29 (×4): 10 mg via RECTAL
  Filled 2017-12-27 (×5): qty 1

## 2017-12-27 MED ORDER — MAGNESIUM OXIDE 400 (241.3 MG) MG PO TABS
400.0000 mg | ORAL_TABLET | Freq: Every day | ORAL | Status: DC
  Filled 2017-12-27 (×2): qty 1

## 2017-12-27 MED ORDER — LACTATED RINGERS IV SOLN
INTRAVENOUS | Status: DC
  Administered 2017-12-27: 02:00:00 via INTRAVENOUS

## 2017-12-27 MED ORDER — FAMOTIDINE IN NACL 20-0.9 MG/50ML-% IV SOLN
20.0000 mg | Freq: Two times a day (BID) | INTRAVENOUS | Status: DC
  Administered 2017-12-27 – 2017-12-29 (×6): 20 mg via INTRAVENOUS
  Filled 2017-12-27 (×7): qty 50

## 2017-12-27 MED ORDER — NALBUPHINE HCL 10 MG/ML IJ SOLN
10.0000 mg | INTRAMUSCULAR | Status: DC | PRN
  Administered 2017-12-27: 10 mg via INTRAVENOUS
  Filled 2017-12-27: qty 1

## 2017-12-27 MED ORDER — PROMETHAZINE HCL 25 MG/ML IJ SOLN
25.0000 mg | Freq: Four times a day (QID) | INTRAMUSCULAR | Status: DC | PRN
  Administered 2017-12-27 – 2017-12-29 (×7): 25 mg via INTRAVENOUS
  Filled 2017-12-27 (×7): qty 1

## 2017-12-27 NOTE — Progress Notes (Signed)
Reviewed labs - changed IVF to addition of 20MEQ KCL per liter D51/2 NS Repeat labs in AM  Maintain NPO status   Marlinda Mikeanya Keidrick Murty CNM [facility]

## 2017-12-27 NOTE — Progress Notes (Addendum)
0000: Pt NPO for US in AM  0129: Pepcid 20 mg IV given.  0132: Nubain a 10 mg IV administered for abdominal pain 5/10. We will continue to monitor.  0300: Dextrose 5% LR d/c'd. LR @200mls  redumed.

## 2017-12-27 NOTE — Progress Notes (Addendum)
Patient seen asleep in bed, no signs of discomfort noted. D5W 1/2 NS w 20mEq kcl infusing @125  mls hr.  2000: Spouse arrived at bedside, no concern voiced.  2217: Dulcolax suppository given  2246: C/o nausea, Phenergan 25 mg IV administered.  0139: C/O IV site burning . New access obtained by CRNA  [phone]: C/O nausea with emesis Phenergan 25 mg IV given.

## 2017-12-27 NOTE — Progress Notes (Signed)
   12/26/17 2348  Vital Signs  BP 113/70  BP Location Right Arm  Patient Position (if appropriate) Semi-fowlers  BP Method Automatic  Pulse Rate (!) 101  Pulse Rate Source Dinamap  Resp 18  Temp 99.4 F (37.4 C)  Temp Source Oral  Oxygen Therapy  SpO2 100 %  Received patient from MAU VIA W/C with LR infusing @200mls /hr. Pt awake, alert and oriented x 4 and accompanied by her husband. Pt denies pain but c/o of having the worst hyperemesis since her pregnancy. No emesis noted at this time but pt continues spitting up. We discussed the Plan of Care,oriented pt to her room with call bell in close reach. Pt verbalized understanding of same.

## 2017-12-27 NOTE — Progress Notes (Signed)
HD #1 - dehydration with vomiting / ptyalism / gallbladder disease  S:  reports feeling better - pain medication helping pain             NPO - no nausea or vomiting this am / no BM x 1 week (routinely 1 to 1.5 weeks between BM)             no bleeding or vaginal              pain controlled with Nubain              up ad lib to void as needed              O:  VS: BP 125/74 (BP Location: Left Arm)   Pulse 68   Temp 98.3 F (36.8 C) (Oral)   Resp 18   Wt 78.5 kg (173 lb)   LMP 09/19/2017   SpO2 99%   BMI 29.70 kg/m   LABS:         Results for TASHANTI, DALPORTO (MRN [id number]) as of 12/27/2017 09:11  Ref. Range 12/26/2017 23:14  Amylase, Serum Latest Ref Range: 28 - 100 U/L 176 (H)  Lipase Latest Ref Range: 11 - 51 U/L 40        Recent Labs    12/26/17 2307  WBC 10.7*  HGB 14.8  PLT 260   CLINICAL DATA:  Elevated amylase, liver enzymes and total bilirubin. Hyperemesis. Abdominal pain. Fourteen weeks pregnant.  EXAM: ULTRASOUND ABDOMEN LIMITED RIGHT UPPER QUADRANT  COMPARISON:  Chest and abdomen radiographs dated 12/01/2017.  FINDINGS: Gallbladder: Dependent sludge in the gallbladder with low-level diffuse internal echoes within the remainder of the bile. No gallstones, gallbladder wall thickening or pericholecystic fluid. No sonographic Murphy sign.  Common bile duct: Diameter: 3.0 mm proximally.  Liver: Normal echotexture with the exception of an elongated echogenic area in the anterior aspect of the left lobe, measuring 7.7 x 6.0 x 1.5 cm. This is mildly heterogeneous. Portal vein is patent on color Doppler imaging with normal direction of blood flow towards the liver.  IMPRESSION: 1. 7.7 x 6.0 x 1.5 cm elongated, mildly heterogeneous echogenic area in the anterior aspect of the left lobe of the liver. This does not have the typical appearance of a mass or focal fat deposition. This could be better characterized with pre and postcontrast  magnetic resonance imaging of the liver.  2. Sludge in the gallbladder with no gallstones or evidence of cholecystitis.              Physical Exam:             Alert and oriented X3  Lungs: Clear and unlabored  Heart: regular rate and rhythm / no mumurs  Abdomen: soft, non-tender, mildly distended, hypoactive BS              FHR per doppler - reassuring  Extremities: no edema, no calf pain or tenderness   A: 14 weeks               Dehydration - improving without further vomiting              Ptyalism - stable with phenergan use for now               gallbladder disease without evidence of choleslithiasis  P: NPO until liver enzymes stabilize in normal range then will start strict LOW fat diet  Nubain for pain / Phenergan for nausea and spitting  repeat labs this AM and again tomorrow AM             Dulcolax suppository every 12 hours until BM             GI consult  Marlinda Mikeanya Zaylynn Rickett CNM, MSN, [facility] 12/27/2017, 9:11 AM

## 2017-12-27 NOTE — Progress Notes (Signed)
Kristen Matthews, CNM made aware of Potassium lab value of 2.6.

## 2017-12-27 NOTE — Progress Notes (Signed)
Abdominal U/S completed. Pt returned to the floor.

## 2017-12-27 NOTE — Progress Notes (Addendum)
Patient given Dulcolax suppository at 1017 today. Patient states, "I just used the bathroom. I had a few squirts to come out, but it wasn't a big bowel movement." Encouraged Patient to continue to inform staff of any bowel movements. Patient verbalizes an understanding.

## 2017-12-27 NOTE — Plan of Care (Signed)
Patient is resting in bed at this time. Denies any pain or discomfort. Patient is pleasant and cooperative.  

## 2017-12-28 ENCOUNTER — Inpatient Hospital Stay (HOSPITAL_COMMUNITY): Payer: Managed Care, Other (non HMO)

## 2017-12-28 LAB — COMPREHENSIVE METABOLIC PANEL
ALT: 107 U/L — ABNORMAL HIGH (ref 14–54)
AST: 76 U/L — ABNORMAL HIGH (ref 15–41)
Albumin: 2.7 g/dL — ABNORMAL LOW (ref 3.5–5.0)
Alkaline Phosphatase: 40 U/L (ref 38–126)
Anion gap: 8 (ref 5–15)
BUN: <5 mg/dL — ABNORMAL LOW (ref 6–20)
CO2: 21 mmol/L — ABNORMAL LOW (ref 22–32)
Calcium: 8.4 mg/dL — ABNORMAL LOW (ref 8.9–10.3)
Chloride: 105 mmol/L (ref 101–111)
Creatinine, Ser: 0.59 mg/dL (ref 0.44–1.00)
GFR calc Af Amer: >60 mL/min (ref >60)
GFR calc non Af Amer: >60 mL/min (ref >60)
Glucose, Bld: 105 mg/dL — ABNORMAL HIGH (ref 65–99)
Potassium: 2.5 mmol/L — CL (ref 3.5–5.1)
Sodium: 134 mmol/L — ABNORMAL LOW (ref 135–145)
Total Bilirubin: 1.7 mg/dL — ABNORMAL HIGH (ref 0.3–1.2)
Total Protein: 6.3 g/dL — ABNORMAL LOW (ref 6.5–8.1)

## 2017-12-28 LAB — AMYLASE: Amylase: 237 U/L — ABNORMAL HIGH (ref 28–100)

## 2017-12-28 LAB — LIPASE, BLOOD: Lipase: 84 U/L — ABNORMAL HIGH (ref 11–51)

## 2017-12-28 LAB — POTASSIUM: Potassium: 3 mmol/L — ABNORMAL LOW (ref 3.5–5.1)

## 2017-12-28 MED ORDER — POTASSIUM CHLORIDE 10 MEQ/100ML IV SOLN
10.0000 meq | INTRAVENOUS | Status: AC
  Administered 2017-12-28 (×2): 10 meq via INTRAVENOUS
  Filled 2017-12-28 (×3): qty 100

## 2017-12-28 MED ORDER — DEXTROSE IN LACTATED RINGERS 5 % IV SOLN
Freq: Once | INTRAVENOUS | Status: AC
  Administered 2017-12-28: 10:00:00 via INTRAVENOUS
  Filled 2017-12-28: qty 10

## 2017-12-28 MED ORDER — PANTOPRAZOLE SODIUM 20 MG PO TBEC
20.0000 mg | DELAYED_RELEASE_TABLET | Freq: Every day | ORAL | Status: DC
  Administered 2017-12-28: 20 mg via ORAL
  Filled 2017-12-28 (×2): qty 1

## 2017-12-28 MED ORDER — SODIUM CHLORIDE 0.45 % IV SOLN
INTRAVENOUS | Status: DC
  Administered 2017-12-28 – 2017-12-29 (×2): via INTRAVENOUS

## 2017-12-28 NOTE — Consult Note (Addendum)
Referring Provider: Dr. Billy Coast  Primary Care Physician:  Patient, No Pcp Per Primary Gastroenterologist:  unassigned  Reason for Consultation:  Possible pancreatitis  HPI: Kristen Matthews is a 26 y.o. female was currently [redacted] weeks pregnant presented to the hospital with worsening nausea, vomiting with new onset of abdominal pain. Patient with recurrent nausea vomiting throughout the pregnancy.Patient started noticing worsening symptoms around through 4 days ago.she also started noticing mild epigastric discomfort which was associated with nausea and vomiting. Few streaks of blood in the vomiting. Complaining of constipation. Denied any black stool or bright red blood per rectum. Marland Kitchen Epigastric discomfort was not associated with the food. denied any radiation of pain to the back.complaining of difficulty swallowing because of intermittent nausea, vomiting. Also complaining of acid reflux.  Upon initial evaluation, she was found to have mildly elevated liver enzymes as well as mildly elevated amylase. Ultrasound liver showed 7.7 x 6 cm heterogenous area at  the anterior aspect of the left lobe of the liver.  History reviewed. No pertinent past medical history.  Past Surgical History:  Procedure Laterality Date  . INDUCED ABORTION    . NO PAST SURGERIES      Prior to Admission medications   Medication Sig Start Date End Date Taking? Authorizing Provider  glycopyrrolate (ROBINUL) 1 MG tablet Take 1 mg by mouth 3 (three) times daily.   Yes [provider]  ondansetron (ZOFRAN-ODT) 4 MG disintegrating tablet Take 4 mg by mouth every 6 (six) hours as needed for nausea or vomiting.  12/10/17  Yes [provider]  polyethylene glycol (MIRALAX / GLYCOLAX) packet Take 17 g by mouth daily.   Yes [provider]  promethazine (PHENERGAN) 25 MG tablet Take 1 tablet (25 mg total) by mouth every 6 (six) hours as needed for nausea or vomiting. 12/01/17  Yes Rolm Bookbinder, CNM  magnesium  oxide (MAG-OX) 400 (241.3 Mg) MG tablet Take 1 tablet (400 mg total) by mouth daily. 12/27/17   Marlinda Mike, CNM  ranitidine (ZANTAC) 300 MG tablet Take 1 tablet (300 mg total) by mouth at bedtime. 12/26/17 03/26/18  Marlinda Mike, CNM    Scheduled Meds: . bisacodyl  10 mg Rectal Q12H  . magnesium oxide  400 mg Oral Daily   Continuous Infusions: . famotidine (PEPCID) IV Stopped (12/28/17 1138)   PRN Meds:.promethazine  Allergies as of 12/26/2017 - Review Complete 12/26/2017  Allergen Reaction Noted  . Peanut-containing drug products Hives and Swelling 12/01/2017    Family History  Problem Relation Age of Onset  . Diabetes Maternal Aunt   . Diabetes Maternal Uncle   . Heart disease Maternal Uncle   . Hypertension Maternal Grandfather   . Diabetes Maternal Grandfather   . Heart disease Maternal Grandfather   . COPD Maternal Grandfather   . Diabetes Paternal Grandmother     Social History   Socioeconomic History  . Marital status: Married    Spouse name: Not on file  . Number of children: Not on file  . Years of education: Not on file  . Highest education level: Not on file  Occupational History  . Not on file  Social Needs  . Financial resource strain: Not on file  . Food insecurity:    Worry: Not on file    Inability: Not on file  . Transportation needs:    Medical: Not on file    Non-medical: Not on file  Tobacco Use  . Smoking status: Former Games developer  . Smokeless tobacco: Never  Used  Substance and Sexual Activity  . Alcohol use: No    Comment: socially  . Drug use: No  . Sexual activity: Not on file  Lifestyle  . Physical activity:    Days per week: Not on file    Minutes per session: Not on file  . Stress: Not on file  Relationships  . Social connections:    Talks on phone: Not on file    Gets together: Not on file    Attends religious service: Not on file    Active member of club or organization: Not on file    Attends meetings of clubs or  organizations: Not on file    Relationship status: Not on file  . Intimate partner violence:    Fear of current or ex partner: Not on file    Emotionally abused: Not on file    Physically abused: Not on file    Forced sexual activity: Not on file  Other Topics Concern  . Not on file  Social History Narrative  . Not on file    Review of Systems: All negative except as stated above in HPI. Review of Systems  Constitutional: Negative for chills, fever and weight loss.  HENT: Negative for hearing loss and tinnitus.   Eyes: Negative for blurred vision and double vision.  Respiratory: Positive for sputum production. Negative for cough and hemoptysis.   Cardiovascular: Negative for chest pain and palpitations.  Gastrointestinal: Positive for abdominal pain, constipation, heartburn, nausea and vomiting. Negative for blood in stool and melena.  Genitourinary: Negative for dysuria and urgency.  Musculoskeletal: Positive for back pain and myalgias.  Skin: Negative for itching and rash.  Neurological: Negative for seizures and loss of consciousness.  Endo/Heme/Allergies: Does not bruise/bleed easily.  Psychiatric/Behavioral: Negative for hallucinations and suicidal ideas.    Physical Exam: Vital signs: Vitals:   12/28/17 0741 12/28/17 1235  BP: 95/60 104/63  Pulse: 79 81  Resp: 16 16  Temp: 98.3 F (36.8 C) 98.6 F (37 C)  SpO2: 99% 99%   Last BM Date: 12/20/17 Physical Exam  Constitutional: She is oriented to person, place, and time. She appears well-developed and well-nourished. No distress.  HENT:  Head: Normocephalic and atraumatic.  Mouth/Throat: No oropharyngeal exudate.  Eyes: EOM are normal. No scleral icterus.  Neck: Normal range of motion. Neck supple.  Cardiovascular: Normal rate, regular rhythm and normal heart sounds.  Pulmonary/Chest: Effort normal and breath sounds normal. No respiratory distress.  Abdominal: Soft. Bowel sounds are normal. There is no tenderness.  There is no rebound and no guarding.  Gravid abdomen  Musculoskeletal: Normal range of motion. She exhibits no edema.  Neurological: She is alert and oriented to person, place, and time.  Skin: Skin is warm. No erythema.  Psychiatric: She has a normal mood and affect. Judgment normal.  Vitals reviewed.   GI:  Lab Results: Recent Labs    12/26/17 2307  WBC 10.7*  HGB 14.8  HCT 39.6  PLT 260   BMET Recent Labs    12/26/17 2202 12/27/17 0936 12/28/17 0552 12/28/17 1502  NA 134* 135 134*  --   K 3.4* 2.6* 2.5* 3.0*  CL 98* 103 105  --   CO2 18* 22 21*  --   GLUCOSE 107* 102* 105*  --   BUN 11 6 <5*  --   CREATININE 0.81 0.59 0.59  --   CALCIUM 10.7* 9.0 8.4*  --    LFT Recent Labs  12/28/17 0552  PROT 6.3*  ALBUMIN 2.7*  AST 76*  ALT 107*  ALKPHOS 40  BILITOT 1.7*   PT/INR No results for input(s): LABPROT, INR in the last 72 hours.   Studies/Results: Mr Abdomen Mrcp Wo Contrast  Result Date: 12/28/2017 CLINICAL DATA:  Abdominal pain with elevated amylase, liver and signs and total bilirubin. Fourteen weeks pregnant. Abnormal ultrasound. EXAM: MRI ABDOMEN WITHOUT CONTRAST  (INCLUDING MRCP) TECHNIQUE: Multiplanar multisequence MR imaging of the abdomen was performed. Heavily T2-weighted images of the biliary and pancreatic ducts were obtained, and three-dimensional MRCP images were rendered by post processing. COMPARISON:  Ultrasound 12/27/2017. FINDINGS: Lower chest: The visualized lower chest appears unremarkable. Hepatobiliary: There is a focal, well-circumscribed area of signal dropout anteriorly in the left hepatic lobe on the gradient echo opposed phase images consistent with focal fat. This spans approximately 6 cm transverse on images 41 through 51 of series 7 and corresponds with the echogenic area on ultrasound. No suspicious hepatic findings. The gallbladder appears normal. There is no biliary dilatation or definite signs of choledocholithiasis. Low  insertion of the cystic duct noted. Pancreas: The pancreatic duct is normal in caliber. No evidence of pancreas divisum. No definite pancreatic inflammation or surrounding fluid collection. Spleen:  Normal in size without focal abnormality. Adrenals/Urinary Tract: Both adrenal glands appear normal. Mild distention of the right renal pelvis and proximal ureter, within physiologic limits of pregnancy. The left kidney appears normal. Stomach/Bowel: No bowel wall thickening, distention or surrounding inflammation. Vascular/Lymphatic: There are no enlarged abdominal lymph nodes. No vascular findings are seen. Other: No ascites or extraluminal fluid collection. Gravid uterus is partially imaged on the coronal images. Musculoskeletal: No acute or significant osseous findings. IMPRESSION: 1. Echogenic area anteriorly in the liver corresponds with focal fat. This requires no follow-up. 2. No biliary dilatation or signs of complicated pancreatitis. Normal pancreatic ductal anatomy. Low insertion of the cystic duct. Electronically Signed   By: Carey Bullocks M.D.   On: 12/28/2017 14:53   Mr 3d Recon At Scanner  Result Date: 12/28/2017 CLINICAL DATA:  Abdominal pain with elevated amylase, liver and signs and total bilirubin. Fourteen weeks pregnant. Abnormal ultrasound. EXAM: MRI ABDOMEN WITHOUT CONTRAST  (INCLUDING MRCP) TECHNIQUE: Multiplanar multisequence MR imaging of the abdomen was performed. Heavily T2-weighted images of the biliary and pancreatic ducts were obtained, and three-dimensional MRCP images were rendered by post processing. COMPARISON:  Ultrasound 12/27/2017. FINDINGS: Lower chest: The visualized lower chest appears unremarkable. Hepatobiliary: There is a focal, well-circumscribed area of signal dropout anteriorly in the left hepatic lobe on the gradient echo opposed phase images consistent with focal fat. This spans approximately 6 cm transverse on images 41 through 51 of series 7 and corresponds with  the echogenic area on ultrasound. No suspicious hepatic findings. The gallbladder appears normal. There is no biliary dilatation or definite signs of choledocholithiasis. Low insertion of the cystic duct noted. Pancreas: The pancreatic duct is normal in caliber. No evidence of pancreas divisum. No definite pancreatic inflammation or surrounding fluid collection. Spleen:  Normal in size without focal abnormality. Adrenals/Urinary Tract: Both adrenal glands appear normal. Mild distention of the right renal pelvis and proximal ureter, within physiologic limits of pregnancy. The left kidney appears normal. Stomach/Bowel: No bowel wall thickening, distention or surrounding inflammation. Vascular/Lymphatic: There are no enlarged abdominal lymph nodes. No vascular findings are seen. Other: No ascites or extraluminal fluid collection. Gravid uterus is partially imaged on the coronal images. Musculoskeletal: No acute or significant osseous findings. IMPRESSION: 1.  Echogenic area anteriorly in the liver corresponds with focal fat. This requires no follow-up. 2. No biliary dilatation or signs of complicated pancreatitis. Normal pancreatic ductal anatomy. Low insertion of the cystic duct. Electronically Signed   By: Carey BullocksWilliam  Veazey M.D.   On: 12/28/2017 14:53   [place] Abdomen Limited  Result Date: 12/27/2017 CLINICAL DATA:  Elevated amylase, liver enzymes and total bilirubin. Hyperemesis. Abdominal pain. Fourteen weeks pregnant. EXAM: ULTRASOUND ABDOMEN LIMITED RIGHT UPPER QUADRANT COMPARISON:  Chest and abdomen radiographs dated 12/01/2017. FINDINGS: Gallbladder: Dependent sludge in the gallbladder with low-level diffuse internal echoes within the remainder of the bile. No gallstones, gallbladder wall thickening or pericholecystic fluid. No sonographic Murphy sign. Common bile duct: Diameter: 3.0 mm proximally. Liver: Normal echotexture with the exception of an elongated echogenic area in the anterior aspect of the left lobe,  measuring 7.7 x 6.0 x 1.5 cm. This is mildly heterogeneous. Portal vein is patent on color Doppler imaging with normal direction of blood flow towards the liver. IMPRESSION: 1. 7.7 x 6.0 x 1.5 cm elongated, mildly heterogeneous echogenic area in the anterior aspect of the left lobe of the liver. This does not have the typical appearance of a mass or focal fat deposition. This could be better characterized with pre and postcontrast magnetic resonance imaging of the liver. 2. Sludge in the gallbladder with no gallstones or evidence of cholecystitis. Electronically Signed   By: Beckie SaltsSteven  Reid M.D.   On: 12/27/2017 08:16    Impression/Plan: - epigastric pain with mildly elevated amylase and lipase. Patient might have mild pancreatitis although her presentation is not consistent with pancreatitis. MRI MRCP showed no evidence of pancreatitis. - Recurrent nausea and vomiting. Likely related to pregnancy. - Abnormal ultrasound. MRI showed focal fatty infiltrate. No biliary ductal dilatation. - Mildly elevated LFTs. Trending down. - acid reflux  Recommendations ------------------------ - patient is feeling better now. Abdominal pain is resolved. MRI MRCP showed no evidence of pancreatitis. No evidence of biliary ductal dilatation or CBD stone. - Recheck LFTs in the morning. Check hepatitis panel. - Start low dose pantoprazole. - start soft diet. - okay to discharge home tomorrow from GI standpoint. - Recommend follow-up in the GI clinic in 4 weeks after discharge. - GI will sign off. Call us back if needed   LOS: 2 days   Kathi DerParag Jaivian Battaglini  MD, FACP 12/28/2017, 4:28 PM  Contact #  [phone]

## 2017-12-28 NOTE — Progress Notes (Signed)
CRITICAL VALUE ALERT  Critical Value: K 2.5  Date & Time  12/28/2017 @0700   Provider 12/28/2017 @0703   Orders Received/Actions taken:  Results called to Linnell Fullinganya Baily CNMW

## 2017-12-28 NOTE — Progress Notes (Signed)
[facility]

## 2017-12-28 NOTE — Progress Notes (Signed)
[facility]

## 2017-12-28 NOTE — Progress Notes (Signed)
Initial Nutrition Assessment  DOCUMENTATION CODES:   Obesity unspecified  INTERVENTION:  Currently NPO,  D5 with MVI at 125 ml/hr Diet per GI  NUTRITION DIAGNOSIS:   Inadequate oral intake related to nausea, vomiting as evidenced by percent weight loss. GOAL:   Patient will meet greater than or equal to 90% of their needs, Weight gain  MONITOR:   Weight trends  REASON FOR ASSESSMENT:  Malnutrition Screening Tool   ASSESSMENT:   14 2/7 weeks, r/o pancreatitis.  weight on 3/26 191 lbs, 18 lb weight loss in 1 month due to n/v, back pain. MRI today, remains NPO  Is at significant risk for malnutrition given increased nutrient needs for  Pregnancy, and > 5% loss of usual weight  Diet Order:  Diet NPO time specified  EDUCATION NEEDS:   No education needs have been identified at this time  Skin:  Skin Assessment: Reviewed RN Assessment  Last BM:     Height:   Ht Readings from Last 1 Encounters:  12/27/17 5\' 4"  (1.626 m)    Weight:   Wt Readings from Last 1 Encounters:  12/27/17 173 lb (78.5 kg)    Ideal Body Weight:   120 lbs  BMI:  Body mass index is 29.7 kg/m.  Estimated Nutritional Needs:   Kcal:  2000-2200  Protein:  90-100 g  Fluid:  2.3 L    Elisabeth CaraKatherine Jerlisa Diliberto M.Odis LusterEd. R.D. LDN Neonatal Nutrition Support Specialist/RD III Pager [phone]      Phone [phone]

## 2017-12-28 NOTE — Progress Notes (Signed)
ANTEPARTUM NOTE - Hospital Day 2  Subjective: reports feeling ok - pain has resolved (no analgesia ~24hours) tolerating po intake / occasional waves of nausea / vomited once yesterday and once early this am remains NPO voiding QS / no BM yet   Objective: VS:  98.6 - 83 - 18 - 101/59  Physical exam:       General appearance/behavior: resting now (phenergan ~ 5am)       Heart: RR       Lungs clear       Abdomen: soft, active BS       Extremities: no edema       FHR 150s with doppler  Results for Kristen Matthews Matthews, Kristen Matthews (MRN [id number]) as of 12/28/2017 08:50  Ref. Range 12/28/2017 05:52  Sodium Latest Ref Range: 135 - 145 mmol/L 134 (L)  Potassium Latest Ref Range: 3.5 - 5.1 mmol/L 2.5 (LL)  Chloride Latest Ref Range: 101 - 111 mmol/L 105  CO2 Latest Ref Range: 22 - 32 mmol/L 21 (L)  Glucose Latest Ref Range: 65 - 99 mg/dL 409105 (H)  BUN Latest Ref Range: 6 - 20 mg/dL <5 (L)  Creatinine Latest Ref Range: 0.44 - 1.00 mg/dL 8.110.59  Calcium Latest Ref Range: 8.9 - 10.3 mg/dL 8.4 (L)  Anion gap Latest Ref Range: 5 - 15  8  Alkaline Phosphatase Latest Ref Range: 38 - 126 U/L 40  Albumin Latest Ref Range: 3.5 - 5.0 g/dL 2.7 (L)  Amylase, Serum Latest Ref Range: 28 - 100 U/L 237 (H)  AST Latest Ref Range: 15 - 41 U/L 76 (H)  ALT Latest Ref Range: 14 - 54 U/L 107 (H)  Total Protein Latest Ref Range: 6.5 - 8.1 g/dL 6.3 (L)  Total Bilirubin Latest Ref Range: 0.3 - 1.2 mg/dL 1.7 (H)  GFR, Est Non African American Latest Ref Range: >60 mL/min >60  GFR, Est African American Latest Ref Range: >60 mL/min >60    Assessment & Plan: 1) 14.[redacted]  weeks gestation with GERD/ ptyalism   2) Pancreatitis with unknown etiology - abnormal sono (?elongated echogenic area in the anterior aspect of the left lobe, measuring 7.7 x 6.0 x 1.5) / no evidence of cholelithiasis or cholecystitis  3) Hypokalemia  4) Constipation    Consulted with Dr Billy Coastaavon updated with patient status / plan of care Awaiting GI consult  today -TC consult call this AM to GI office /  remain NPO until GI consultation/recommendations DC Nubain - no pain / no use in ~24hours / continue phenergan PRN / continue dulcolax suppository / IV pepcid Change IVF (liet D5LR with MVI then back to NS)           Add 3 runs potassium today over next 4hrs - recheck potassium level at 3pm Repeat labs in AM (or per GI recommendations)  Marlinda Mikeanya Keagan Brislin CNM, MSN, [facility] 12/28/2017, 8:56 AM

## 2017-12-28 NOTE — Progress Notes (Signed)
GI consult completed - visit pending this afternoon Dr Doneta PublicMaygod [facility]    - coordination of service by nursing for transport roundtrip for testing    - test ordered   Marlinda Mikeanya Sui Kasparek CNM

## 2017-12-29 LAB — HEPATITIS PANEL, ACUTE
HCV Ab: <0.1 s/co ratio (ref 0.0–0.9)
HEP B S AG: NEGATIVE
Hep A IgM: NEGATIVE
Hep B C IgM: NEGATIVE

## 2017-12-29 LAB — COMPREHENSIVE METABOLIC PANEL
ALT: 105 U/L — ABNORMAL HIGH (ref 14–54)
AST: 67 U/L — ABNORMAL HIGH (ref 15–41)
Albumin: 2.5 g/dL — ABNORMAL LOW (ref 3.5–5.0)
Alkaline Phosphatase: 37 U/L — ABNORMAL LOW (ref 38–126)
Anion gap: 9 (ref 5–15)
BUN: <5 mg/dL — ABNORMAL LOW (ref 6–20)
CO2: 24 mmol/L (ref 22–32)
Calcium: 8.4 mg/dL — ABNORMAL LOW (ref 8.9–10.3)
Chloride: 104 mmol/L (ref 101–111)
Creatinine, Ser: 0.43 mg/dL — ABNORMAL LOW (ref 0.44–1.00)
GFR calc Af Amer: >60 mL/min (ref >60)
GFR calc non Af Amer: >60 mL/min (ref >60)
Glucose, Bld: 84 mg/dL (ref 65–99)
Potassium: 2.8 mmol/L — ABNORMAL LOW (ref 3.5–5.1)
Sodium: 137 mmol/L (ref 135–145)
Total Bilirubin: 1.4 mg/dL — ABNORMAL HIGH (ref 0.3–1.2)
Total Protein: 5.6 g/dL — ABNORMAL LOW (ref 6.5–8.1)

## 2017-12-29 LAB — AMYLASE: Amylase: 261 U/L — ABNORMAL HIGH (ref 28–100)

## 2017-12-29 LAB — LIPASE, BLOOD: Lipase: 105 U/L — ABNORMAL HIGH (ref 11–51)

## 2017-12-29 MED ORDER — POTASSIUM CHLORIDE ER 10 MEQ PO TBCR: 10.0000 meq | EXTENDED_RELEASE_TABLET | Freq: Every day | ORAL | 0 refills | Status: DC

## 2017-12-29 MED ORDER — BUPIVACAINE HCL (PF) 0.5 % IJ SOLN
INTRAMUSCULAR | Status: AC
  Filled 2017-12-29: qty 30

## 2017-12-29 MED ORDER — PANTOPRAZOLE SODIUM 20 MG PO TBEC: 20.0000 mg | DELAYED_RELEASE_TABLET | Freq: Two times a day (BID) | ORAL | 2 refills | Status: DC

## 2017-12-29 MED ORDER — CONCEPT DHA 53.5-38-1 MG PO CAPS: 1.0000 | ORAL_CAPSULE | Freq: Every day | ORAL | 4 refills | Status: DC

## 2017-12-29 MED ORDER — PROMETHAZINE HCL 12.5 MG PO TABS: 12.5000 mg | ORAL_TABLET | Freq: Four times a day (QID) | ORAL | 0 refills | Status: DC | PRN

## 2017-12-29 MED ORDER — MAGNESIUM OXIDE 400 (241.3 MG) MG PO TABS: 400.0000 mg | ORAL_TABLET | Freq: Every day | ORAL | 4 refills | Status: DC

## 2017-12-29 MED ORDER — ONDANSETRON 4 MG PO TBDP: 4.0000 mg | ORAL_TABLET | Freq: Four times a day (QID) | ORAL | 1 refills | Status: DC | PRN

## 2017-12-29 MED ORDER — MAGNESIUM OXIDE 400 (241.3 MG) MG PO TABS
400.0000 mg | ORAL_TABLET | Freq: Every day | ORAL | Status: DC
  Administered 2017-12-29: 400 mg via ORAL
  Filled 2017-12-29 (×2): qty 1

## 2017-12-29 MED ORDER — PANTOPRAZOLE SODIUM 20 MG PO TBEC
20.0000 mg | DELAYED_RELEASE_TABLET | Freq: Two times a day (BID) | ORAL | Status: DC
  Administered 2017-12-29: 20 mg via ORAL
  Filled 2017-12-29 (×3): qty 1

## 2017-12-29 MED ORDER — BISACODYL 10 MG RE SUPP: 10.0000 mg | Freq: Every day | RECTAL | 0 refills | Status: DC | PRN

## 2017-12-29 NOTE — Progress Notes (Addendum)
ANTEPARTUM NOTE - Hospital Day 3  Subjective: Reports feeling better - no pain Tolerating po intake / mild waves of nausea / no vomiting  Bowel habits -small BM this morning / Voiding QS Report no bleeding  Objective: Vital signs: VS: Blood pressure (!) 101/57, pulse 80, temperature 98.6 F (37 C), temperature source Oral, resp. rate 18, height 5\' 4"  (1.626 m), weight 78.5 kg (173 lb), last menstrual period 09/19/2017, SpO2 100 %, unknown if currently breastfeeding.  Labs: Results for Bonnye FavaHARPER, Denis (MRN [id number]) as of 12/29/2017 08:45  Ref. Range 12/29/2017 05:10  Sodium Latest Ref Range: 135 - 145 mmol/L 137  Potassium Latest Ref Range: 3.5 - 5.1 mmol/L 2.8 (L)  Chloride Latest Ref Range: 101 - 111 mmol/L 104  CO2 Latest Ref Range: 22 - 32 mmol/L 24  Glucose Latest Ref Range: 65 - 99 mg/dL 84  BUN Latest Ref Range: 6 - 20 mg/dL <5 (L)  Creatinine Latest Ref Range: 0.44 - 1.00 mg/dL 8.290.43 (L)  Calcium Latest Ref Range: 8.9 - 10.3 mg/dL 8.4 (L)  Anion gap Latest Ref Range: 5 - 15  9  Alkaline Phosphatase Latest Ref Range: 38 - 126 U/L 37 (L)  Albumin Latest Ref Range: 3.5 - 5.0 g/dL 2.5 (L)  Amylase, Serum Latest Ref Range: 28 - 100 U/L 261 (H)  Lipase Latest Ref Range: 11 - 51 U/L 105 (H)  AST Latest Ref Range: 15 - 41 U/L 67 (H)  ALT Latest Ref Range: 14 - 54 U/L 105 (H)  Total Protein Latest Ref Range: 6.5 - 8.1 g/dL 5.6 (L)  Total Bilirubin Latest Ref Range: 0.3 - 1.2 mg/dL 1.4 (H)  GFR, Est Non African American Latest Ref Range: >60 mL/min >60  GFR, Est African American Latest Ref Range: >60 mL/min >60    Physical exam:        General appearance/behavior: calm without pain       Heart: RR       Lungs clear       Abdomen: soft and non-tender, active BS, FHR 150s       Extremities: no edema  Assessment: clinically improved 14.[redacted] weeks gestation with ptyalism GERD Resolved dehydration - need to maintain GOOD water intake daily Hypokalemia  - supplement x 1 week then  recheck levels Mild pancreatitis - questionable gallbladder etiology versus other    Plan:  STRICT low fat diet at home with good water intake daily Continue anticholinergic medications for ptyalism - Robinal 1mg  TID and Bonine chewable every 12 hours Protonix 20mg  BID KCL supplement x 1 week Phenergan PRN vomiting              avoid Zofran until constipation resolved & use only if persistent vomiting & phenergan not effective  [facility] GI office visit consult to be arranged for 1 month  Dr Billy Coastaavon updated with patient status / plan of care  Marlinda Mikeanya Bailey CNM, MSN, [facility] 12/29/2017, 8:45 AM  Agree with plan of care Appreciate GI consult

## 2018-01-15 LAB — OB RESULTS CONSOLE GC/CHLAMYDIA
Chlamydia: NEGATIVE
GC PROBE AMP, GENITAL: NEGATIVE

## 2018-01-15 LAB — OB RESULTS CONSOLE HEPATITIS B SURFACE ANTIGEN: Hepatitis B Surface Ag: NEGATIVE

## 2018-01-15 LAB — OB RESULTS CONSOLE RUBELLA ANTIBODY, IGM: Rubella: IMMUNE

## 2018-01-15 LAB — OB RESULTS CONSOLE HIV ANTIBODY (ROUTINE TESTING): HIV: NONREACTIVE

## 2018-01-15 NOTE — Discharge Summary (Signed)
POSTOPERATIVE DISCHARGE SUMMARY:  Patient ID: Kristen Matthews MRN: [id number] DOB/AGE: Sep 27, 1990 26 y.o.  Admit date: 12/26/2017 Admission Diagnoses: nausea with vomiting, epigastric pain, dehydration, constipation, ptyalism  Discharge date: 12/29/2017  Discharge Diagnoses: mild pancreatitis of unknown etiology, low potassium-stable on supplements, vomiting resolved  Prenatal history: [id number]   EDC : 06/26/2018, by Last Menstrual Period  Prenatal care at [facility] Primary provider : Thresa Ross CNM Prenatal course complicated by hyperemesis  Medical / Surgical History :  Past medical history: History reviewed. No pertinent past medical history.  Past surgical history:  Past Surgical History:  Procedure Laterality Date  . INDUCED ABORTION    . NO PAST SURGERIES      Family History:  Family History  Problem Relation Age of Onset  . Diabetes Maternal Aunt   . Diabetes Maternal Uncle   . Heart disease Maternal Uncle   . Hypertension Maternal Grandfather   . Diabetes Maternal Grandfather   . Heart disease Maternal Grandfather   . COPD Maternal Grandfather   . Diabetes Paternal Grandmother     Social History:  reports that she has quit smoking. She has never used smokeless tobacco. She reports that she does not drink alcohol or use drugs.  Allergies: Peanut-containing drug products   Current Medications at time of admission:  Prior to Admission medications   Medication Sig Start Date End Date Taking? Authorizing Provider  glycopyrrolate (ROBINUL) 1 MG tablet Take 1 mg by mouth 3 (three) times daily.   Yes [provider]  polyethylene glycol (MIRALAX / GLYCOLAX) packet Take 17 g by mouth daily.   Yes [provider]  promethazine (PHENERGAN) 25 MG tablet Take 1 tablet (25 mg total) by mouth every 6 (six) hours as needed for nausea or vomiting. 12/01/17  Yes Rolm Bookbinder, CNM  bisacodyl (DULCOLAX) 10 MG suppository Place 1 suppository (10 mg total) rectally  daily as needed for mild constipation or moderate constipation. 12/29/17   Marlinda Mike, CNM  magnesium oxide (MAG-OX) 400 (241.3 Mg) MG tablet Take 1 tablet (400 mg total) by mouth daily. 12/29/17   Marlinda Mike, CNM  ondansetron (ZOFRAN-ODT) 4 MG disintegrating tablet Take 1 tablet (4 mg total) by mouth every 6 (six) hours as needed for nausea or vomiting. 12/29/17   Marlinda Mike, CNM  pantoprazole (PROTONIX) 20 MG tablet Take 1 tablet (20 mg total) by mouth 2 (two) times daily. 12/29/17   Marlinda Mike, CNM  potassium chloride (K-DUR) 10 MEQ tablet Take 1 tablet (10 mEq total) by mouth daily. 12/29/17   Marlinda Mike, CNM  Prenat-FeFum-FePo-FA-Omega 3 (CONCEPT DHA) 53.5-38-1 MG CAPS Take 1 capsule by mouth daily. 12/29/17   Marlinda Mike, CNM  promethazine (PHENERGAN) 12.5 MG tablet Take 1 tablet (12.5 mg total) by mouth every 6 (six) hours as needed for nausea or vomiting. 12/29/17   Marlinda Mike, [facility] Course - rehydration with IVF, potassium replacement, treatment for constipation (Zofran induced) - magnesium, colace, dulcolax.   GI consult for elevated liver enzymes and mild pancreatitis of unknown etiology. Possible gallbladder disease - no evidence of stone blockage - normal bile duct.  Discharge Instructions:  Discharged Condition: stable  Activity: pelvic rest and postoperative restrictions x 2   Diet: routine  Medications:  Allergies as of 12/29/2017      Reactions   Peanut-containing Drug Products Hives, Swelling      Medication List    STOP taking these medications   calcium carbonate 500 MG chewable tablet Commonly  known as:  TUMS - dosed in mg elemental calcium   pseudoephedrine-acetaminophen 30-500 MG Tabs tablet Commonly known as:  TYLENOL SINUS     TAKE these medications   bisacodyl 10 MG suppository Commonly known as:  DULCOLAX Place 1 suppository (10 mg total) rectally daily as needed for mild constipation or moderate constipation.   CONCEPT DHA  53.5-38-1 MG Caps Take 1 capsule by mouth daily.   glycopyrrolate 1 MG tablet Commonly known as:  ROBINUL Take 1 mg by mouth 3 (three) times daily. What changed:  Another medication with the same name was removed. Continue taking this medication, and follow the directions you see here.   magnesium oxide 400 (241.3 Mg) MG tablet Commonly known as:  MAG-OX Take 1 tablet (400 mg total) by mouth daily.   ondansetron 4 MG disintegrating tablet Commonly known as:  ZOFRAN-ODT Take 1 tablet (4 mg total) by mouth every 6 (six) hours as needed for nausea or vomiting.   pantoprazole 20 MG tablet Commonly known as:  PROTONIX Take 1 tablet (20 mg total) by mouth 2 (two) times daily.   polyethylene glycol packet Commonly known as:  MIRALAX / GLYCOLAX Take 17 g by mouth daily.   potassium chloride 10 MEQ tablet Commonly known as:  K-DUR Take 1 tablet (10 mEq total) by mouth daily.   promethazine 25 MG tablet Commonly known as:  PHENERGAN Take 1 tablet (25 mg total) by mouth every 6 (six) hours as needed for nausea or vomiting. What changed:  Another medication with the same name was added. Make sure you understand how and when to take each.   promethazine 12.5 MG tablet Commonly known as:  PHENERGAN Take 1 tablet (12.5 mg total) by mouth every 6 (six) hours as needed for nausea or vomiting. What changed:  You were already taking a medication with the same name, and this prescription was added. Make sure you understand how and when to take each.      Postpartum Instructions: [facility] discharge booklet - instructions reviewed  Discharge to: Home  Follow up :  [facility]BC in 3 days - recheck labs at office - arrange GI follow-up Strict low fat diet               Signed: Marlinda Mike CNM, MSN, [facility] 01/15/2018, 8:49 AM

## 2018-01-18 ENCOUNTER — Other Ambulatory Visit: Payer: Self-pay | Admitting: Obstetrics and Gynecology

## 2018-04-14 LAB — OB RESULTS CONSOLE RPR: RPR: NONREACTIVE

## 2018-04-21 IMAGING — US US ABDOMEN LIMITED
1 series · 15 of 25 positions shown · non-contrast
Comparison: Chest and abdomen radiographs dated 12/01/2017.

CLINICAL DATA: Elevated amylase, liver enzymes and total bilirubin.
Hyperemesis. Abdominal pain. Fourteen weeks pregnant.

EXAM:
ULTRASOUND ABDOMEN LIMITED RIGHT UPPER QUADRANT

[Series 1: us abdomen limited · 65 acquisitions, 15 frames shown]
[im 1/65]
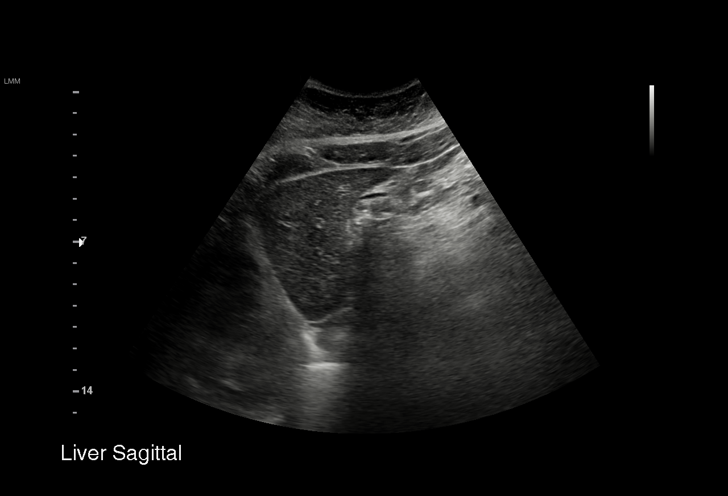
[im 6/65]
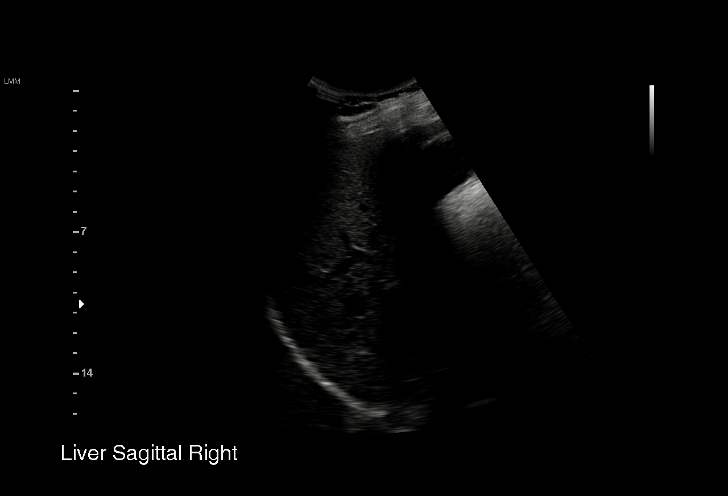
[im 11/65]
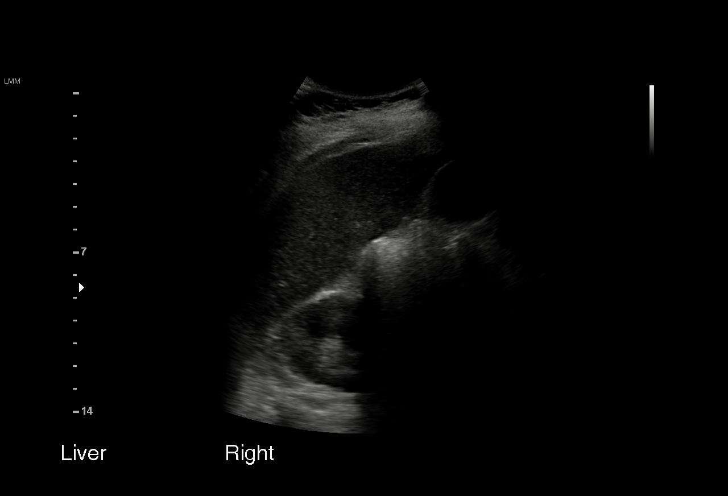
[im 14/65]
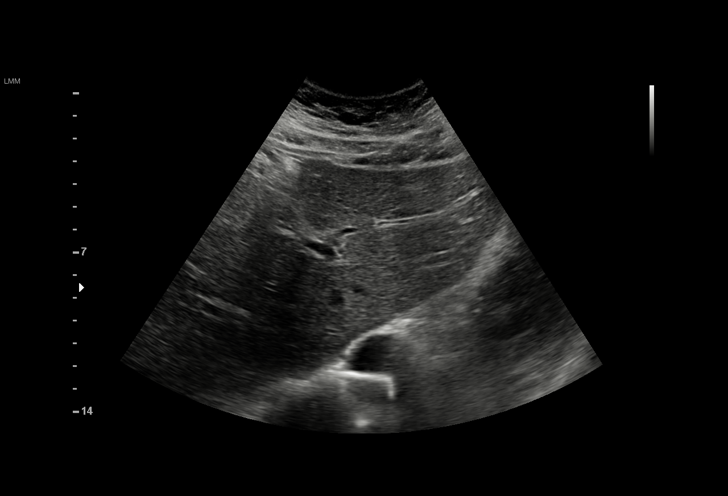
[im 19/65]
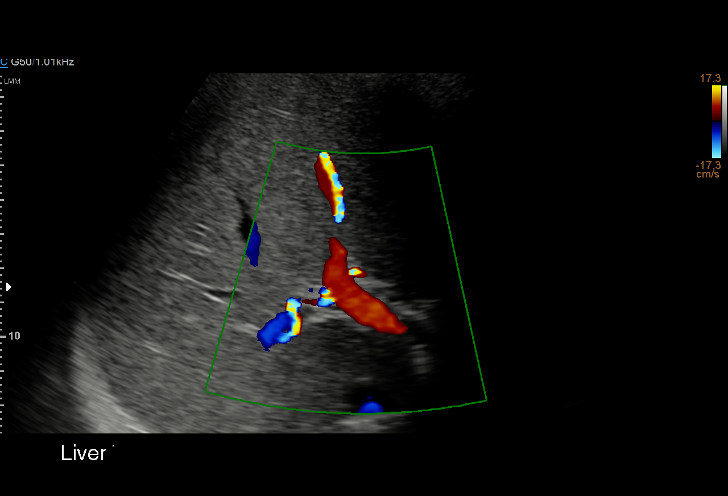
[im 25/65]
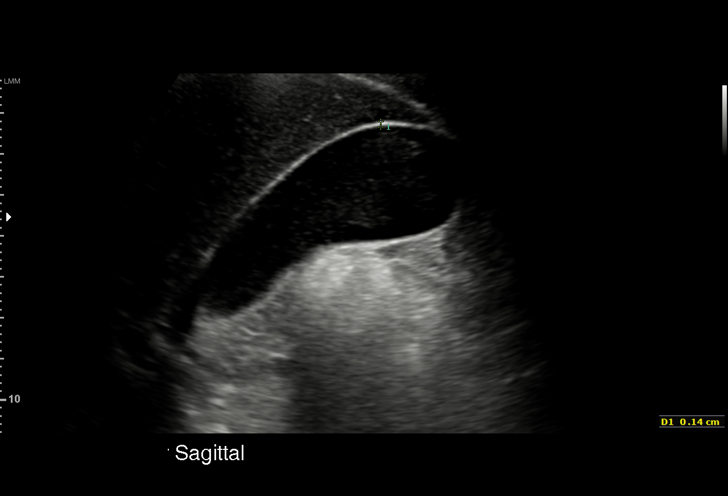
[im 27/65]
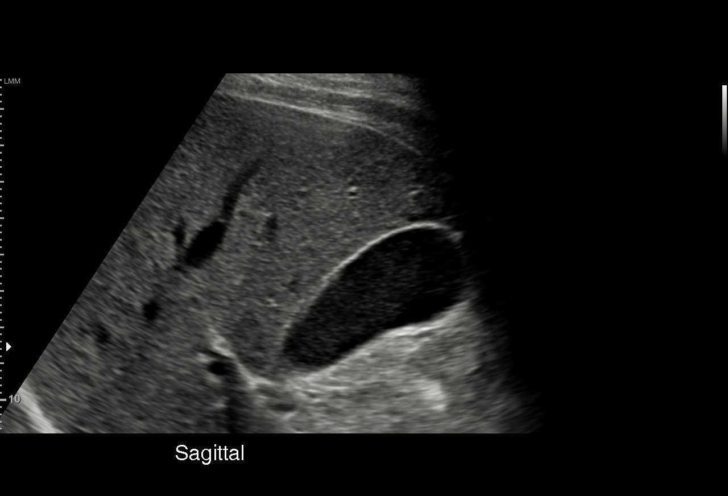
[im 33/65]
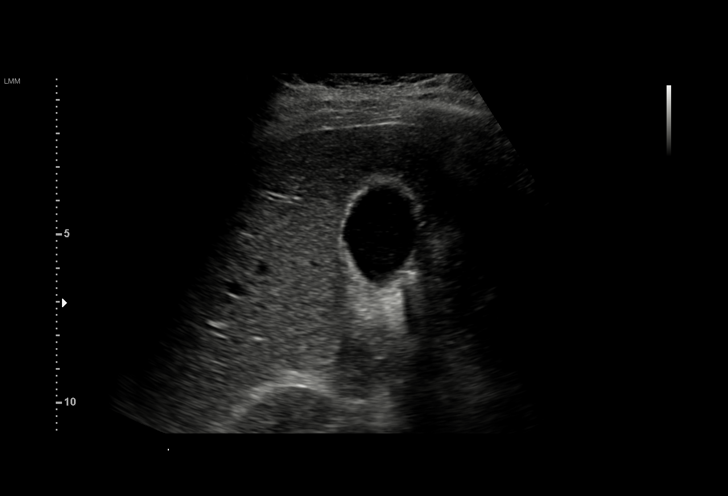
[im 38/65]
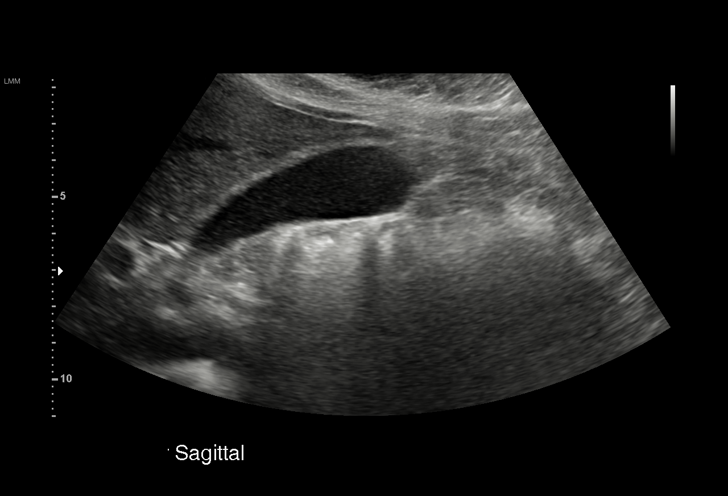
[im 41/65]
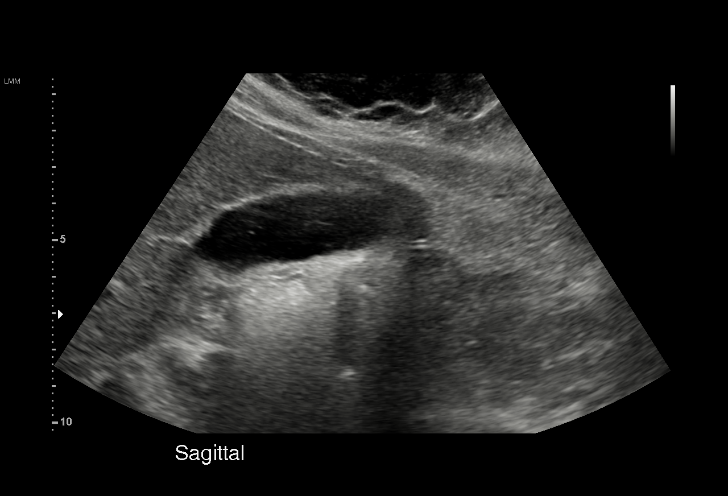
[im 46/65]
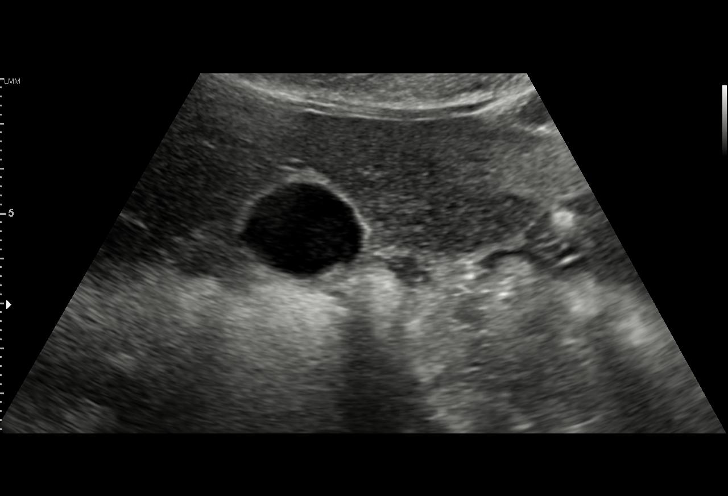
[im 51/65]
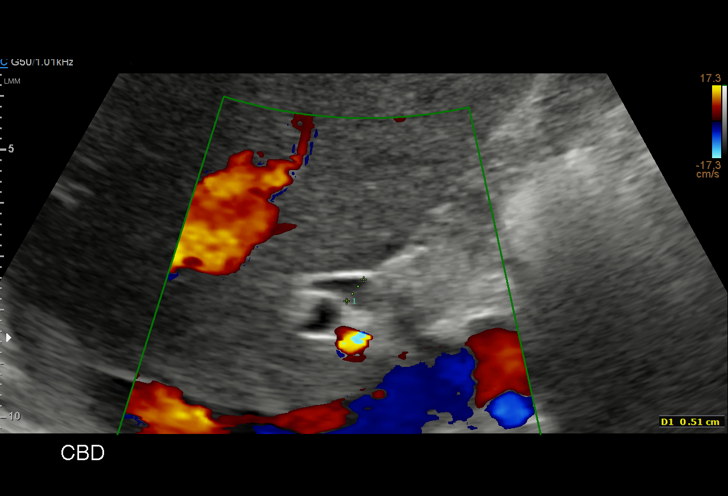
[im 54/65]
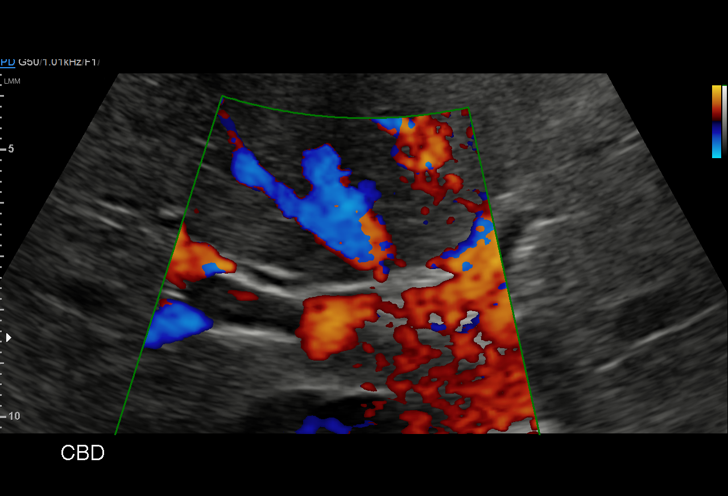
[im 59/65]
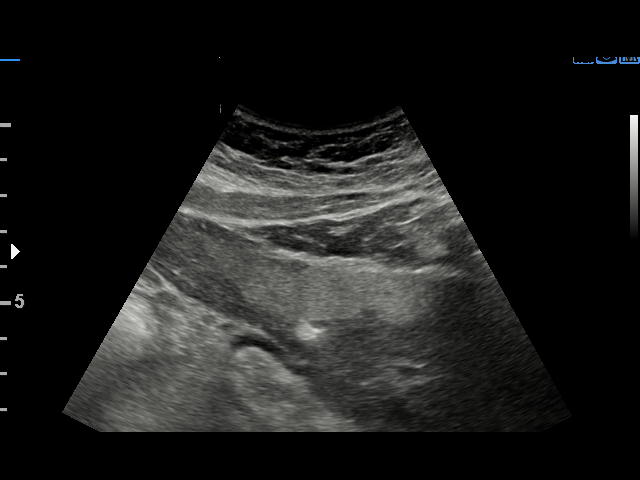
[im 65/65]
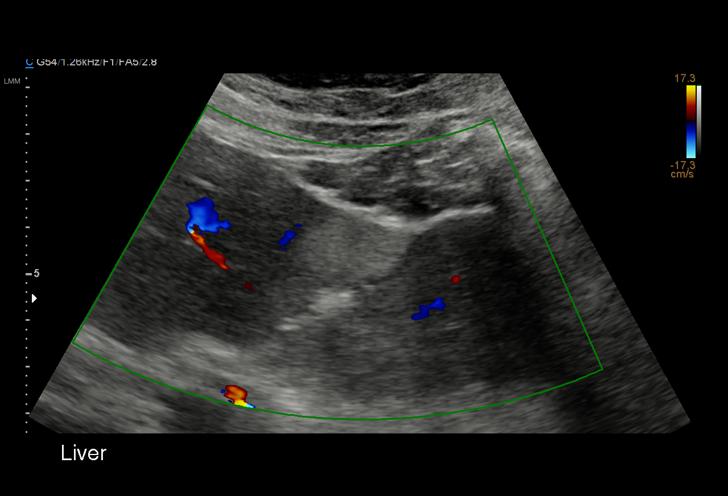

[15 of 25 positions shown; findings below may reference images not displayed]

FINDINGS: Gallbladder:

Dependent sludge in the gallbladder with low-level diffuse internal
echoes within the remainder of the bile. No gallstones, gallbladder
wall thickening or pericholecystic fluid. No sonographic Murphy
sign.

Common bile duct:

Diameter: 3.0 mm proximally.

Liver:

Normal echotexture with the exception of an elongated echogenic area
in the anterior aspect of the left lobe, measuring 7.7 x 6.0 x
cm. This is mildly heterogeneous. Portal vein is patent on color
Doppler imaging with normal direction of blood flow towards the
liver.
IMPRESSION: 1. 7.7 x 6.0 x 1.5 cm elongated, mildly heterogeneous echogenic area
in the anterior aspect of the left lobe of the liver. This does not
have the typical appearance of a mass or focal fat deposition. This
could be better characterized with pre and postcontrast magnetic
resonance imaging of the liver.
2. Sludge in the gallbladder with no gallstones or evidence of
cholecystitis.

## 2018-05-27 LAB — OB RESULTS CONSOLE GBS: STREP GROUP B AG: NEGATIVE

## 2018-06-23 ENCOUNTER — Inpatient Hospital Stay (HOSPITAL_COMMUNITY)
Admission: AD | Admit: 2018-06-23 | Discharge: 2018-06-24 | DRG: 806 | Disposition: A | Discharge status: Home / Self Care | Payer: Managed Care, Other (non HMO) | Attending: Obstetrics and Gynecology | Admitting: Obstetrics and Gynecology

## 2018-06-23 ENCOUNTER — Encounter (HOSPITAL_COMMUNITY): Payer: Self-pay

## 2018-06-23 ENCOUNTER — Other Ambulatory Visit: Payer: Self-pay

## 2018-06-23 DIAGNOSIS — Z87891 Personal history of nicotine dependence: Secondary | ICD-10-CM

## 2018-06-23 DIAGNOSIS — O2662 Liver and biliary tract disorders in childbirth: Secondary | ICD-10-CM | POA: Diagnosis present

## 2018-06-23 DIAGNOSIS — Z3483 Encounter for supervision of other normal pregnancy, third trimester: Secondary | ICD-10-CM | POA: Diagnosis present

## 2018-06-23 DIAGNOSIS — K760 Fatty (change of) liver, not elsewhere classified: Secondary | ICD-10-CM | POA: Diagnosis present

## 2018-06-23 DIAGNOSIS — D509 Iron deficiency anemia, unspecified: Secondary | ICD-10-CM | POA: Diagnosis present

## 2018-06-23 DIAGNOSIS — Z3A39 39 weeks gestation of pregnancy: Secondary | ICD-10-CM | POA: Diagnosis not present

## 2018-06-23 DIAGNOSIS — O9902 Anemia complicating childbirth: Secondary | ICD-10-CM | POA: Diagnosis present

## 2018-06-23 DIAGNOSIS — E860 Dehydration: Secondary | ICD-10-CM

## 2018-06-23 HISTORY — DX: Gastro-esophageal reflux disease without esophagitis: K21.9

## 2018-06-23 HISTORY — DX: Abnormal levels of other serum enzymes: R74.8

## 2018-06-23 LAB — TYPE AND SCREEN
ABO/RH(D): O POS
Antibody Screen: NEGATIVE

## 2018-06-23 LAB — CBC
HEMATOCRIT: 31 % — AB (ref 36.0–46.0)
Hemoglobin: 10.1 g/dL — ABNORMAL LOW (ref 12.0–15.0)
MCH: 23.8 pg — ABNORMAL LOW (ref 26.0–34.0)
MCHC: 32.6 g/dL (ref 30.0–36.0)
MCV: 72.9 fL — AB (ref 80.0–100.0)
Platelets: 292 10*3/uL (ref 150–400)
RBC: 4.25 MIL/uL (ref 3.87–5.11)
RDW: 16.8 % — AB (ref 11.5–15.5)
WBC: 11.6 10*3/uL — ABNORMAL HIGH (ref 4.0–10.5)
nRBC: 0 % (ref 0.0–0.2)

## 2018-06-23 LAB — RPR: RPR Ser Ql: NONREACTIVE

## 2018-06-23 MED ORDER — ONDANSETRON HCL 4 MG/2ML IJ SOLN: 4.0000 mg | Freq: Four times a day (QID) | INTRAMUSCULAR | Status: DC | PRN

## 2018-06-23 MED ORDER — LACTATED RINGERS IV SOLN
INTRAVENOUS | Status: DC
  Administered 2018-06-23: 06:00:00 via INTRAVENOUS

## 2018-06-23 MED ORDER — OXYCODONE-ACETAMINOPHEN 5-325 MG PO TABS: 2.0000 | ORAL_TABLET | ORAL | Status: DC | PRN

## 2018-06-23 MED ORDER — WITCH HAZEL-GLYCERIN EX PADS: 1.0000 "application " | MEDICATED_PAD | CUTANEOUS | Status: DC | PRN

## 2018-06-23 MED ORDER — FENTANYL CITRATE (PF) 100 MCG/2ML IJ SOLN
INTRAMUSCULAR | Status: AC
  Filled 2018-06-23: qty 2

## 2018-06-23 MED ORDER — ACETAMINOPHEN 325 MG PO TABS
650.0000 mg | ORAL_TABLET | ORAL | Status: DC | PRN
  Administered 2018-06-23: 650 mg via ORAL
  Filled 2018-06-23: qty 2

## 2018-06-23 MED ORDER — SOD CITRATE-CITRIC ACID 500-334 MG/5ML PO SOLN: 30.0000 mL | ORAL | Status: DC | PRN

## 2018-06-23 MED ORDER — TETANUS-DIPHTH-ACELL PERTUSSIS 5-2.5-18.5 LF-MCG/0.5 IM SUSP: 0.5000 mL | Freq: Once | INTRAMUSCULAR | Status: DC

## 2018-06-23 MED ORDER — ONDANSETRON HCL 4 MG/2ML IJ SOLN: 4.0000 mg | INTRAMUSCULAR | Status: DC | PRN

## 2018-06-23 MED ORDER — PRENATAL MULTIVITAMIN CH
1.0000 | ORAL_TABLET | Freq: Every day | ORAL | Status: DC
  Administered 2018-06-23 – 2018-06-24 (×2): 1 via ORAL
  Filled 2018-06-23 (×2): qty 1

## 2018-06-23 MED ORDER — DIBUCAINE 1 % RE OINT: 1.0000 "application " | TOPICAL_OINTMENT | RECTAL | Status: DC | PRN

## 2018-06-23 MED ORDER — OXYCODONE-ACETAMINOPHEN 5-325 MG PO TABS: 1.0000 | ORAL_TABLET | ORAL | Status: DC | PRN

## 2018-06-23 MED ORDER — OXYTOCIN 40 UNITS IN LACTATED RINGERS INFUSION - SIMPLE MED
INTRAVENOUS | Status: AC
  Filled 2018-06-23: qty 1000

## 2018-06-23 MED ORDER — LIDOCAINE HCL (PF) 1 % IJ SOLN
INTRAMUSCULAR | Status: AC
  Filled 2018-06-23: qty 30

## 2018-06-23 MED ORDER — FLEET ENEMA 7-19 GM/118ML RE ENEM: 1.0000 | ENEMA | RECTAL | Status: DC | PRN

## 2018-06-23 MED ORDER — COCONUT OIL OIL: 1.0000 "application " | TOPICAL_OIL | Status: DC | PRN

## 2018-06-23 MED ORDER — LIDOCAINE HCL (PF) 1 % IJ SOLN
30.0000 mL | INTRAMUSCULAR | Status: DC | PRN
  Administered 2018-06-23: 30 mL via SUBCUTANEOUS
  Filled 2018-06-23: qty 30

## 2018-06-23 MED ORDER — IBUPROFEN 600 MG PO TABS
600.0000 mg | ORAL_TABLET | Freq: Four times a day (QID) | ORAL | Status: DC
  Administered 2018-06-23 – 2018-06-24 (×5): 600 mg via ORAL
  Filled 2018-06-23 (×5): qty 1

## 2018-06-23 MED ORDER — METHYLERGONOVINE MALEATE 0.2 MG PO TABS: 0.2000 mg | ORAL_TABLET | ORAL | Status: DC | PRN

## 2018-06-23 MED ORDER — DIPHENHYDRAMINE HCL 25 MG PO CAPS: 25.0000 mg | ORAL_CAPSULE | Freq: Four times a day (QID) | ORAL | Status: DC | PRN

## 2018-06-23 MED ORDER — METHYLERGONOVINE MALEATE 0.2 MG/ML IJ SOLN: 0.2000 mg | INTRAMUSCULAR | Status: DC | PRN

## 2018-06-23 MED ORDER — FENTANYL CITRATE (PF) 100 MCG/2ML IJ SOLN
100.0000 ug | INTRAMUSCULAR | Status: DC | PRN
  Administered 2018-06-23: 100 ug via INTRAVENOUS

## 2018-06-23 MED ORDER — OXYCODONE-ACETAMINOPHEN 5-325 MG PO TABS
1.0000 | ORAL_TABLET | ORAL | Status: DC | PRN
  Filled 2018-06-23: qty 1

## 2018-06-23 MED ORDER — OXYTOCIN 40 UNITS IN LACTATED RINGERS INFUSION - SIMPLE MED: 2.5000 [IU]/h | INTRAVENOUS | Status: DC

## 2018-06-23 MED ORDER — SENNOSIDES-DOCUSATE SODIUM 8.6-50 MG PO TABS
2.0000 | ORAL_TABLET | ORAL | Status: DC
  Administered 2018-06-23: 2 via ORAL
  Filled 2018-06-23: qty 2

## 2018-06-23 MED ORDER — OXYTOCIN BOLUS FROM INFUSION
500.0000 mL | Freq: Once | INTRAVENOUS | Status: AC
  Administered 2018-06-23: 500 mL via INTRAVENOUS

## 2018-06-23 MED ORDER — LACTATED RINGERS IV SOLN
500.0000 mL | INTRAVENOUS | Status: DC | PRN
  Administered 2018-06-23: 500 mL via INTRAVENOUS

## 2018-06-23 MED ORDER — ZOLPIDEM TARTRATE 5 MG PO TABS: 5.0000 mg | ORAL_TABLET | Freq: Every evening | ORAL | Status: DC | PRN

## 2018-06-23 MED ORDER — BENZOCAINE-MENTHOL 20-0.5 % EX AERO: 1.0000 "application " | INHALATION_SPRAY | CUTANEOUS | Status: DC | PRN

## 2018-06-23 MED ORDER — ONDANSETRON HCL 4 MG PO TABS: 4.0000 mg | ORAL_TABLET | ORAL | Status: DC | PRN

## 2018-06-23 MED ORDER — ACETAMINOPHEN 325 MG PO TABS: 650.0000 mg | ORAL_TABLET | ORAL | Status: DC | PRN

## 2018-06-23 MED ORDER — SIMETHICONE 80 MG PO CHEW: 80.0000 mg | CHEWABLE_TABLET | ORAL | Status: DC | PRN

## 2018-06-23 NOTE — Lactation Note (Signed)
This note was copied from a baby's chart. Lactation Consultation Note  Patient Name: Kristen Matthews [id number] Date: 06/23/2018 Reason for consult: Initial assessment;Term   Initial consult with exp BF mom of 6 hour old infant. Infant awake and cueing to feed, mom latched infant to the breast independently using good head and pillow support, infant is not latch for very long and came off and would not relatch at this time. Enc mom to feed infant STS with feeding cues, offering both breasts with each feeding. Mom reports she is aware of how to hand express, enc hand expression prior to latch and after latch to apply to nipples. Enc mom to massage/intermittently compress breast during feeding.   BF Resources handout and LC Brochure given, mom informed of IP/OP Services, BF Support Groups and LC phone #. Mom reprogts she has no questions/concerns at this time. Enc mom to call out for feeding assistance as needed.    Maternal Data Formula Feeding for Exclusion: No Has patient been taught Hand Expression?: Yes Does the patient have breastfeeding experience prior to this delivery?: Yes  Feeding Feeding Type: Breast Fed  LATCH Score Latch: Grasps breast easily, tongue down, lips flanged, rhythmical sucking.  Audible Swallowing: A few with stimulation  Type of Nipple: Everted at rest and after stimulation  Comfort (Breast/Nipple): Soft / non-tender  Hold (Positioning): No assistance needed to correctly position infant at breast.  LATCH Score: 9  Interventions Interventions: Breast feeding basics reviewed;Support pillows;Position options;Skin to skin;Breast compression;Hand express  Lactation Tools Discussed/Used WIC Program: No   Consult Status Consult Status: Follow-up Date: 06/24/18 Follow-up type: In-patient    Silas Flood Rafay Dahan 06/23/2018, 3:01 PM

## 2018-06-23 NOTE — MAU Note (Signed)
Pt reporting cntx since 0430. Now 5 mins apart. Denies LOF or bleeding. +FM

## 2018-06-23 NOTE — H&P (Signed)
Kristen Matthews is a 27 y.o. female presenting for labor. OB History    Gravida  5   Para  2   Term  2   Preterm      AB  2   Living  2     SAB      TAB  2   Ectopic      Multiple  0   Live Births  2          Past Medical History:  Diagnosis Date  . Elevated liver enzymes   . GERD (gastroesophageal reflux disease)    Past Surgical History:  Procedure Laterality Date  . INDUCED ABORTION    . NO PAST SURGERIES     Family History: family history includes COPD in her maternal grandfather; Diabetes in her maternal aunt, maternal grandfather, maternal uncle, and paternal grandmother; Heart disease in her maternal grandfather and maternal uncle; Hypertension in her maternal grandfather. Social History:  reports that she has quit smoking. She has never used smokeless tobacco. She reports that she does not drink alcohol or use drugs.     Maternal Diabetes: No Genetic Screening: Normal Maternal Ultrasounds/Referrals: Normal Fetal Ultrasounds or other Referrals:  None Maternal Substance Abuse:  No Significant Maternal Medications:  None Significant Maternal Lab Results:  None Other Comments:  None  Review of Systems  Constitutional: Negative.   All other systems reviewed and are negative.  Maternal Medical History:  Reason for admission: Contractions.   Contractions: Onset was 1-2 hours ago.   Frequency: regular.   Perceived severity is moderate.    Fetal activity: Perceived fetal activity is normal.   Last perceived fetal movement was within the past hour.    Prenatal complications: no prenatal complications Prenatal Complications - Diabetes: none.    Dilation: 7.5 Effacement (%): 90 Station: -1 Exam by:: Kristen Frederic, RN Blood pressure 114/70, pulse 98, temperature 98 F (36.7 C), temperature source Oral, resp. rate 18, last menstrual period 09/19/2017, SpO2 100 %, unknown if currently breastfeeding. Maternal Exam:  Uterine Assessment: Contraction  strength is moderate.  Contraction frequency is regular.   Abdomen: Patient reports no abdominal tenderness. Fetal presentation: vertex  Introitus: Normal vulva. Normal vagina.  Ferning test: not done.  Nitrazine test: not done. Amniotic fluid character: not assessed.  Pelvis: adequate for delivery.   Cervix: Cervix evaluated by digital exam.     Physical Exam  Nursing note and vitals reviewed. Constitutional: She is oriented to person, place, and time. She appears well-developed and well-nourished.  HENT:  Head: Normocephalic and atraumatic.  Neck: Normal range of motion. Neck supple.  Cardiovascular: Normal rate and regular rhythm.  Respiratory: Effort normal and breath sounds normal.  GI: Soft. Bowel sounds are normal.  Genitourinary: Vagina normal and uterus normal.  Musculoskeletal: Normal range of motion.  Neurological: She is alert and oriented to person, place, and time. She has normal reflexes.  Skin: Skin is warm and dry.  Psychiatric: She has a normal mood and affect.    Prenatal labs: ABO, Rh:   Antibody:   Rubella:   RPR:    HBsAg: Negative (04/22 1719)  HIV:    GBS:     Assessment/Plan: Term IUP Active labor Admit Epidural GBS neg   Kristen Matthews 06/23/2018, 6:40 AM

## 2018-06-24 DIAGNOSIS — D509 Iron deficiency anemia, unspecified: Secondary | ICD-10-CM | POA: Diagnosis present

## 2018-06-24 LAB — COMPREHENSIVE METABOLIC PANEL
ALK PHOS: 107 U/L (ref 38–126)
ALT: 10 U/L (ref 0–44)
AST: 27 U/L (ref 15–41)
Albumin: 2.4 g/dL — ABNORMAL LOW (ref 3.5–5.0)
Anion gap: 7 (ref 5–15)
BUN: 8 mg/dL (ref 6–20)
CALCIUM: 8.5 mg/dL — AB (ref 8.9–10.3)
CO2: 20 mmol/L — AB (ref 22–32)
CREATININE: 0.71 mg/dL (ref 0.44–1.00)
Chloride: 108 mmol/L (ref 98–111)
Glucose, Bld: 88 mg/dL (ref 70–99)
Potassium: 3.8 mmol/L (ref 3.5–5.1)
SODIUM: 135 mmol/L (ref 135–145)
Total Bilirubin: 0.4 mg/dL (ref 0.3–1.2)
Total Protein: 6.6 g/dL (ref 6.5–8.1)

## 2018-06-24 LAB — CBC
HCT: 28.3 % — ABNORMAL LOW (ref 36.0–46.0)
HEMOGLOBIN: 9.2 g/dL — AB (ref 12.0–15.0)
MCH: 24 pg — AB (ref 26.0–34.0)
MCHC: 32.5 g/dL (ref 30.0–36.0)
MCV: 73.9 fL — AB (ref 80.0–100.0)
Platelets: 297 10*3/uL (ref 150–400)
RBC: 3.83 MIL/uL — AB (ref 3.87–5.11)
RDW: 17 % — ABNORMAL HIGH (ref 11.5–15.5)
WBC: 11.8 10*3/uL — ABNORMAL HIGH (ref 4.0–10.5)
nRBC: 0 % (ref 0.0–0.2)

## 2018-06-24 MED ORDER — POLYSACCHARIDE IRON COMPLEX 150 MG PO CAPS: 150.0000 mg | ORAL_CAPSULE | Freq: Every day | ORAL | 0 refills | Status: DC

## 2018-06-24 MED ORDER — MAGNESIUM OXIDE 400 (241.3 MG) MG PO TABS: 400.0000 mg | ORAL_TABLET | Freq: Every day | ORAL | 0 refills | Status: DC

## 2018-06-24 MED ORDER — POLYSACCHARIDE IRON COMPLEX 150 MG PO CAPS
150.0000 mg | ORAL_CAPSULE | Freq: Every day | ORAL | Status: DC
  Administered 2018-06-24: 150 mg via ORAL
  Filled 2018-06-24: qty 1

## 2018-06-24 MED ORDER — MAGNESIUM OXIDE 400 (241.3 MG) MG PO TABS
400.0000 mg | ORAL_TABLET | Freq: Every day | ORAL | Status: DC
  Administered 2018-06-24: 400 mg via ORAL
  Filled 2018-06-24: qty 1

## 2018-06-24 MED ORDER — IBUPROFEN 600 MG PO TABS: 600.0000 mg | ORAL_TABLET | Freq: Four times a day (QID) | ORAL | 0 refills | Status: AC

## 2018-06-24 NOTE — Discharge Summary (Signed)
Obstetric Discharge Summary Reason for Admission: onset of labor Prenatal Procedures: ultrasound and serial labs and monitor for elevated LE - pancreatitis Intrapartum Procedures: spontaneous vaginal delivery Postpartum Procedures: none Complications-Operative and Postpartum: vaginal laceration Hemoglobin  Date Value Ref Range Status  06/24/2018 9.2 (L) 12.0 - 15.0 g/dL Final   HCT  Date Value Ref Range Status  06/24/2018 28.3 (L) 36.0 - 46.0 % Final    Physical Exam:  General: alert, cooperative and no distress Lochia: appropriate Uterine Fundus: firm Incision: healing well DVT Evaluation: No evidence of DVT seen on physical exam.  Discharge Diagnoses: Term Pregnancy-delivered  Discharge Information: Date: 06/24/2018 Activity: pelvic rest Diet: routine with low fat intake Medications: PNV, Ibuprofen, Iron and magnesium Condition: stable Instructions: refer to practice specific booklet Discharge to: home Follow-up Information    Olivia Mackie, MD. Schedule an appointment as soon as possible for a visit in 6 week(s).   Specialty:  Obstetrics and Gynecology Why:  see GI physician in next 3 months Contact information: [address] Mauro Kaufmann [place] [address] [phone]           Newborn Data: Live born female  Birth Weight: 7 lb 1.6 oz (3220 g) APGAR: 8, 9  Newborn Delivery   Birth date/time:  06/23/2018 08:12:00 Delivery type:  Vaginal, Spontaneous     Home with mother.  Marlinda Mike 06/24/2018, 11:16 AM

## 2018-06-24 NOTE — Lactation Note (Signed)
This note was copied from a baby's chart. Lactation Consultation Note  Patient Name: Kristen Matthews [name] Date: 06/24/2018 Reason for consult: Follow-up assessment P3, 19 hr female infant Per mom infant had one wet and one soiled diaper. Per mom. She doesn't know how to hand express well, LC worked with mom on hand expression and mom expressed 3 ml of colostrum that was spoon feed to infant. Per mom, she never tried cross cradle position but is willing to try. LC assisted mom in latching infant to left breast using cross cradle hold, infant mouth wide gape and audible swallowing observed.  Mom BF for 15 minutes and was still feeding infant as LC left room. Mom feels this position to breast feed infant is easier and she likes it. Mom will call nurse or LC if she has any further questions, concerns or need assistance with latching infant to breast.  Maternal Data    Feeding Feeding Type: Breast Fed  LATCH Score Latch: Grasps breast easily, tongue down, lips flanged, rhythmical sucking.  Audible Swallowing: Spontaneous and intermittent  Type of Nipple: Everted at rest and after stimulation  Comfort (Breast/Nipple): Soft / non-tender  Hold (Positioning): Assistance needed to correctly position infant at breast and maintain latch.  LATCH Score: 9  Interventions Interventions: Assisted with latch;Breast compression;Hand express;Breast massage  Lactation Tools Discussed/Used     Consult Status Consult Status: Follow-up Date: 06/25/18 Follow-up type: In-patient    Kristen Matthews 06/24/2018, 3:33 AM

## 2018-06-24 NOTE — Progress Notes (Signed)
PPD 1 SVD with vaginal LAC repair  S:  Reports feeling well - no GI symptoms - tired as no sleep all night             Tolerating po/ No nausea or vomiting             Bleeding is light             Pain controlled with Motrin             Up ad lib / ambulatory / voiding QS  Newborn Breast / Circumcision planned  O:     VS: BP 110/68   Pulse 88   Temp 98.1 F (36.7 C) (Axillary)   Resp 18   Ht 5' 4.5" (1.638 m)   Wt 100.3 kg   LMP 09/19/2017   SpO2 100%   Breastfeeding? Unknown   BMI 37.36 kg/m    LABS:       SGOT - 27  / SGPT 10     Recent Labs    06/23/18 0630 06/24/18 0554  WBC 11.6* 11.8*  HGB 10.1* 9.2*  PLT 292 297               Blood type: --/--/O POS (10/16 0630)  Rubella: Immune (05/10 0000)                   Tdap and flu current 2019   I&O: Intake/Output      10/16 0701 - 10/17 0700 10/17 0701 - 10/18 0700   I.V. (mL/kg) 1042.2 (10.4)    Total Intake(mL/kg) 1042.2 (10.4)    Blood 375    Total Output 375    Net +667.2                     Physical Exam:             Alert and oriented X3  Abdomen: soft, non-tender, non-distended              Fundus: firm, non-tender, Ueven  Lochia: light-moderate  Extremities: no edema, no calf pain or tenderness    A: PPD # 1 SVD with vaginal LAC repair             Fatty liver disease              IDA of pregnancy  Doing well - stable status  P: Routine post partum orders  DC tomorrow  Marlinda Mike CNM, MSN, [facility] 06/24/2018, 8:20 AM

## 2021-07-24 ENCOUNTER — Other Ambulatory Visit: Payer: Self-pay

## 2021-07-24 ENCOUNTER — Ambulatory Visit
Admission: EM | Admit: 2021-07-24 | Discharge: 2021-07-24 | Disposition: A | Discharge status: Home / Self Care | Payer: 59 | Attending: Physician Assistant | Admitting: Physician Assistant

## 2021-07-24 DIAGNOSIS — J209 Acute bronchitis, unspecified: Secondary | ICD-10-CM

## 2021-07-24 MED ORDER — PREDNISONE 20 MG PO TABS: 40.0000 mg | ORAL_TABLET | Freq: Every day | ORAL | 0 refills | Status: AC

## 2021-07-24 NOTE — ED Triage Notes (Signed)
Pt c/o cough, sore throat, and headache x1wk, now having lt ear pain. States food taste salty.

## 2021-07-24 NOTE — ED Provider Notes (Signed)
[facility]    CSN: [id number] Arrival date & time: 07/24/21  0807      History   Chief Complaint Chief Complaint  Patient presents with   Cough    HPI Kristen Matthews is a 30 y.o. female.   Here today for evaluation of nasal congestion, sore throat and cough she has had for the last week.  She states that she started to have a salty taste in her mouth as well.  She denies any fever.  She has tried multiple over-the-counter medications without significant relief.  The history is provided by the patient.  Cough Associated symptoms: ear pain and sore throat   Associated symptoms: no chills, no eye discharge, no fever, no shortness of breath and no wheezing    Past Medical History:  Diagnosis Date   Elevated liver enzymes    GERD (gastroesophageal reflux disease)     Patient Active Problem List   Diagnosis Date Noted   IDA (iron deficiency anemia) 06/24/2018   Indication for care in labor and delivery, antepartum 06/23/2018   SVD (spontaneous vaginal delivery) 06/23/2018   Postpartum care following vaginal delivery (10/16) 06/23/2018   Obstetrical laceration: vaginal 06/23/2018   Elevated liver enzymes 12/27/2017   Dehydration 12/26/2017   GERD (gastroesophageal reflux disease) 12/26/2017    Past Surgical History:  Procedure Laterality Date   INDUCED ABORTION     NO PAST SURGERIES      OB History     Gravida  5   Para  3   Term  3   Preterm      AB  2   Living  3      SAB      IAB  2   Ectopic      Multiple  0   Live Births  3            Home Medications    Prior to Admission medications   Medication Sig Start Date End Date Taking? Authorizing Provider  predniSONE (DELTASONE) 20 MG tablet Take 2 tablets (40 mg total) by mouth daily with breakfast for 5 days. 07/24/21 07/29/21 Yes Tomi Bamberger, PA-C  cetirizine (ZYRTEC) 10 MG tablet Take 10 mg by mouth daily.    [provider]  ibuprofen (ADVIL,MOTRIN) 600 MG  tablet Take 1 tablet (600 mg total) by mouth every 6 (six) hours. 06/24/18   Marlinda Mike, CNM    Family History Family History  Problem Relation Age of Onset   Diabetes Maternal Aunt    Diabetes Maternal Uncle    Heart disease Maternal Uncle    Hypertension Maternal Grandfather    Diabetes Maternal Grandfather    Heart disease Maternal Grandfather    COPD Maternal Grandfather    Diabetes Paternal Grandmother     Social History Social History   Tobacco Use   Smoking status: Former   Smokeless tobacco: Never  Building services engineer Use: Unknown  Substance Use Topics   Alcohol use: No    Comment: socially   Drug use: No     Allergies   Peanut-containing drug products   Review of Systems Review of Systems  Constitutional:  Negative for chills and fever.  HENT:  Positive for congestion, ear pain and sore throat. Negative for sinus pressure.   Eyes:  Negative for discharge and redness.  Respiratory:  Positive for cough. Negative for shortness of breath and wheezing.     Physical Exam Triage Vital Signs ED Triage  Vitals [07/24/21 0819]  Enc Vitals Group     BP 119/86     Pulse Rate 89     Resp 18     Temp 98.7 F (37.1 C)     Temp Source Oral     SpO2 96 %     Weight      Height      Head Circumference      Peak Flow      Pain Score 3     Pain Loc      Pain Edu?      Excl. in GC?    No data found.  Updated Vital Signs BP 119/86 (BP Location: Left Arm)   Pulse 89   Temp 98.7 F (37.1 C) (Oral)   Resp 18   SpO2 96%   Breastfeeding No      Physical Exam Vitals and nursing note reviewed.  Constitutional:      General: She is not in acute distress.    Appearance: Normal appearance. She is not ill-appearing.  HENT:     Head: Normocephalic and atraumatic.     Right Ear: Tympanic membrane normal.     Left Ear: Tympanic membrane normal.     Nose: Congestion present.     Mouth/Throat:     Mouth: Mucous membranes are moist.     Pharynx: No  oropharyngeal exudate or posterior oropharyngeal erythema.  Eyes:     Conjunctiva/sclera: Conjunctivae normal.  Cardiovascular:     Rate and Rhythm: Normal rate and regular rhythm.     Heart sounds: Normal heart sounds. No murmur heard. Pulmonary:     Effort: Pulmonary effort is normal. No respiratory distress.     Breath sounds: Normal breath sounds. No wheezing, rhonchi or rales.  Skin:    General: Skin is warm and dry.  Neurological:     Mental Status: She is alert.  Psychiatric:        Mood and Affect: Mood normal.        Thought Content: Thought content normal.     UC Treatments / Results  Labs (all labs ordered are listed, but only abnormal results are displayed) Labs Reviewed - No data to display  EKG   Radiology No results found.  Procedures Procedures (including critical care time)  Medications Ordered in UC Medications - No data to display  Initial Impression / Assessment and Plan / UC Course  I have reviewed the triage vital signs and the nursing notes.  Pertinent labs & imaging results that were available during my care of the patient were reviewed by me and considered in my medical decision making (see chart for details).  Suspect likely early bronchitis but did discuss possibility of early sinus infection as well.  Patient has not had significant sinus pressure so we will treat with steroid burst in hopes to improve symptoms.  Encouraged follow-up if symptoms fail to improve or worsen.  Final Clinical Impressions(s) / UC Diagnoses   Final diagnoses:  Acute bronchitis, unspecified organism   Discharge Instructions   None    ED Prescriptions     Medication Sig Dispense Auth. Provider   predniSONE (DELTASONE) 20 MG tablet Take 2 tablets (40 mg total) by mouth daily with breakfast for 5 days. 10 tablet Tomi Bamberger, PA-C      PDMP not reviewed this encounter.   Tomi Bamberger, PA-C 07/24/21 [phone]

## 2021-12-17 ENCOUNTER — Telehealth: Payer: 59 | Admitting: Nurse Practitioner

## 2021-12-17 DIAGNOSIS — G43009 Migraine without aura, not intractable, without status migrainosus: Secondary | ICD-10-CM

## 2021-12-17 MED ORDER — SUMATRIPTAN SUCCINATE 50 MG PO TABS: 50.0000 mg | ORAL_TABLET | ORAL | 1 refills | Status: AC | PRN

## 2021-12-17 NOTE — Patient Instructions (Signed)
Migraine Headache A migraine headache is an intense, throbbing pain on one side or both sides of the head. Migraine headaches may also cause other symptoms, such as nausea, vomiting, and sensitivity to light and noise. A migraine headache can last from 4 hours to 3 days. Talk with your doctor about what things may bring on (trigger) your migraine headaches. What are the causes? The exact cause of this condition is not known. However, a migraine may be caused when nerves in the brain become irritated and release chemicals that cause inflammation of blood vessels. This inflammation causes pain. This condition may be triggered or caused by: Drinking alcohol. Smoking. Taking medicines, such as: Medicine used to treat chest pain (nitroglycerin). Birth control pills. Estrogen. Certain blood pressure medicines. Eating or drinking products that contain nitrates, glutamate, aspartame, or tyramine. Aged cheeses, chocolate, or caffeine may also be triggers. Doing physical activity. Other things that may trigger a migraine headache include: Menstruation. Pregnancy. Hunger. Stress. Lack of sleep or too much sleep. Weather changes. Fatigue. What increases the risk? The following factors may make you more likely to experience migraine headaches: Being a certain age. This condition is more common in people who are 25-55 years old. Being female. Having a family history of migraine headaches. Being Caucasian. Having a mental health condition, such as depression or anxiety. Being obese. What are the signs or symptoms? The main symptom of this condition is pulsating or throbbing pain. This pain may: Happen in any area of the head, such as on one side or both sides. Interfere with daily activities. Get worse with physical activity. Get worse with exposure to bright lights or loud noises. Other symptoms may include: Nausea. Vomiting. Dizziness. General sensitivity to bright lights, loud noises, or  smells. Before you get a migraine headache, you may get warning signs (an aura). An aura may include: Seeing flashing lights or having blind spots. Seeing bright spots, halos, or zigzag lines. Having tunnel vision or blurred vision. Having numbness or a tingling feeling. Having trouble talking. Having muscle weakness. Some people have symptoms after a migraine headache (postdromal phase), such as: Feeling tired. Difficulty concentrating. How is this diagnosed? A migraine headache can be diagnosed based on: Your symptoms. A physical exam. Tests, such as: CT scan or an MRI of the head. These imaging tests can help rule out other causes of headaches. Taking fluid from the spine (lumbar puncture) and analyzing it (cerebrospinal fluid analysis, or CSF analysis). How is this treated? This condition may be treated with medicines that: Relieve pain. Relieve nausea. Prevent migraine headaches. Treatment for this condition may also include: Acupuncture. Lifestyle changes like avoiding foods that trigger migraine headaches. Biofeedback. Cognitive behavioral therapy. Follow these instructions at home: Medicines Take over-the-counter and prescription medicines only as told by your health care provider. Ask your health care provider if the medicine prescribed to you: Requires you to avoid driving or using heavy machinery. Can cause constipation. You may need to take these actions to prevent or treat constipation: Drink enough fluid to keep your urine pale yellow. Take over-the-counter or prescription medicines. Eat foods that are high in fiber, such as beans, whole grains, and fresh fruits and vegetables. Limit foods that are high in fat and processed sugars, such as fried or sweet foods. Lifestyle Do not drink alcohol. Do not use any products that contain nicotine or tobacco, such as cigarettes, e-cigarettes, and chewing tobacco. If you need help quitting, ask your health care  provider. Get at least 8   hours of sleep every night. Find ways to manage stress, such as meditation, deep breathing, or yoga. General instructions   Keep a journal to find out what may trigger your migraine headaches. For example, write down: What you eat and drink. How much sleep you get. Any change to your diet or medicines. If you have a migraine headache: Avoid things that make your symptoms worse, such as bright lights. It may help to lie down in a dark, quiet room. Do not drive or use heavy machinery. Ask your health care provider what activities are safe for you while you are experiencing symptoms. Keep all follow-up visits as told by your health care provider. This is important. Contact a health care provider if: You develop symptoms that are different or more severe than your usual migraine headache symptoms. You have more than 15 headache days in one month. Get help right away if: Your migraine headache becomes severe. Your migraine headache lasts longer than 72 hours. You have a fever. You have a stiff neck. You have vision loss. Your muscles feel weak or like you cannot control them. You start to lose your balance often. You have trouble walking. You faint. You have a seizure. Summary A migraine headache is an intense, throbbing pain on one side or both sides of the head. Migraines may also cause other symptoms, such as nausea, vomiting, and sensitivity to light and noise. This condition may be treated with medicines and lifestyle changes. You may also need to avoid certain things that trigger a migraine headache. Keep a journal to find out what may trigger your migraine headaches. Contact your health care provider if you have more than 15 headache days in a month or you develop symptoms that are different or more severe than your usual migraine headache symptoms. This information is not intended to replace advice given to you by your health care provider. Make sure you  discuss any questions you have with your health care provider. Document Revised: 12/17/2018 Document Reviewed: 10/07/2018 Elsevier Patient Education  2022 Elsevier Inc.  

## 2021-12-17 NOTE — Progress Notes (Signed)
? ?Virtual Visit Consent  ? ?[name], you are scheduled for a virtual visit with Mary-Margaret Daphine Deutscher, FNP, a [facility] provider, today.   ?  ?Just as with appointments in the office, your consent must be obtained to participate.  Your consent will be active for this visit and any virtual visit you may have with one of our providers in the next 365 days.   ?  ?If you have a MyChart account, a copy of this consent can be sent to you electronically.  All virtual visits are billed to your insurance company just like a traditional visit in the office.   ? ?As this is a virtual visit, video technology does not allow for your provider to perform a traditional examination.  This may limit your provider's ability to fully assess your condition.  If your provider identifies any concerns that need to be evaluated in person or the need to arrange testing (such as labs, EKG, etc.), we will make arrangements to do so.   ?  ?Although advances in technology are sophisticated, we cannot ensure that it will always work on either your end or our end.  If the connection with a video visit is poor, the visit may have to be switched to a telephone visit.  With either a video or telephone visit, we are not always able to ensure that we have a secure connection.    ? ?I need to obtain your verbal consent now.   Are you willing to proceed with your visit today? YES ?  ?[name] has provided verbal consent on 12/17/2021 for a virtual visit (video or telephone). ?  ?Mary-Margaret Daphine Deutscher, FNP  ? ?Date: 12/17/2021 6:49 PM ? ? ?Virtual Visit via Video Note  ? ?I, Mary-Margaret Daphine Deutscher, connected with Kristen Matthews ([id number], [birth year]) on 12/17/21 at  7:00 PM EDT by a video-enabled telemedicine application and verified that I am speaking with the correct person using two identifiers. ? ?Location: ?Patient: Virtual Visit Location Patient: Home ?Provider: Virtual Visit Location Provider: Mobile ?  ?I discussed the limitations of  evaluation and management by telemedicine and the availability of in person appointments. The patient expressed understanding and agreed to proceed.   ? ?History of Present Illness: ?Kristen Matthews is a 31 y.o. who identifies as a female who was assigned female at birth, and is being seen today for migraine. ? ?HPI: Patient has been having intermittent headaches for several months now but have been getting wore. Usually on the left side of head. Rates 8-10/10. Tylenol and motrin will ease it up bt never goes away. She des admit to drinking caffeine often. She doe snot have a PCP and just made an appointmengt oday with new PC but canty be seen until MAy. ?  ?Review of Systems  ?Eyes:  Positive for photophobia (mild). Negative for blurred vision, double vision, pain, discharge and redness.  ?Neurological:  Positive for headaches.  ? ?Problems:  ?Patient Active Problem List  ? Diagnosis Date Noted  ? IDA (iron deficiency anemia) 06/24/2018  ? Indication for care in labor and delivery, antepartum 06/23/2018  ? SVD (spontaneous vaginal delivery) 06/23/2018  ? Postpartum care following vaginal delivery (10/16) 06/23/2018  ? Obstetrical laceration: vaginal 06/23/2018  ? Elevated liver enzymes 12/27/2017  ? Dehydration 12/26/2017  ? GERD (gastroesophageal reflux disease) 12/26/2017  ?  ?Allergies:  ?Allergies  ?Allergen Reactions  ? Peanut-Containing Drug Products Hives and Swelling  ? ?Medications:  ?Current Outpatient Medications:  ?  cetirizine (ZYRTEC)  10 MG tablet, Take 10 mg by mouth daily., Disp: , Rfl:  ?  ibuprofen (ADVIL,MOTRIN) 600 MG tablet, Take 1 tablet (600 mg total) by mouth every 6 (six) hours., Disp: 30 tablet, Rfl: 0 ? ?Observations/Objective: ?Patient is well-developed, well-nourished in no acute distress.  ?Resting comfortably  at home.  ?Head is normocephalic, atraumatic.  ?No labored breathing.  ?Speech is clear and coherent with logical content.  ?Patient is alert and oriented at baseline.  ?Patient  rubbing head and squinting ? ?Assessment and Plan: ? ?[name] in today with chief complaint of Headache ? ? ?1. Migraine without aura and without status migrainosus, not intractable ?Avoid caffeine ?Rest  in dark room ?Cool compresses ?Keep appointment with PCP ?- SUMAtriptan (IMITREX) 50 MG tablet; Take 1 tablet (50 mg total) by mouth every 2 (two) hours as needed for migraine. May repeat in 2 hours if headache persists or recurs.  Dispense: 10 tablet; Refill: 1 ? ? ? ?Follow Up Instructions: ?I discussed the assessment and treatment plan with the patient. The patient was provided an opportunity to ask questions and all were answered. The patient agreed with the plan and demonstrated an understanding of the instructions.  A copy of instructions were sent to the patient via MyChart. ? ?The patient was advised to call back or seek an in-person evaluation if the symptoms worsen or if the condition fails to improve as anticipated. ? ?Time:  ?I spent 13 minutes with the patient via telehealth technology discussing the above problems/concerns.   ? ?Mary-Margaret Daphine Deutscher, FNP ? ?

## 2022-01-03 NOTE — Progress Notes (Deleted)
   New Patient Office Visit  Subjective    Patient ID: Kristen Matthews, female    DOB: [birth year]  Age: 31 y.o. MRN: [id number]  CC: No chief complaint on file.   HPI Kristen Matthews presents to establish care ***  Outpatient Encounter Medications as of 01/06/2022  Medication Sig   cetirizine (ZYRTEC) 10 MG tablet Take 10 mg by mouth daily.   ibuprofen (ADVIL,MOTRIN) 600 MG tablet Take 1 tablet (600 mg total) by mouth every 6 (six) hours.   SUMAtriptan (IMITREX) 50 MG tablet Take 1 tablet (50 mg total) by mouth every 2 (two) hours as needed for migraine. May repeat in 2 hours if headache persists or recurs.   No facility-administered encounter medications on file as of 01/06/2022.    Past Medical History:  Diagnosis Date   Elevated liver enzymes    GERD (gastroesophageal reflux disease)     Past Surgical History:  Procedure Laterality Date   INDUCED ABORTION     NO PAST SURGERIES      Family History  Problem Relation Age of Onset   Diabetes Maternal Aunt    Diabetes Maternal Uncle    Heart disease Maternal Uncle    Hypertension Maternal Grandfather    Diabetes Maternal Grandfather    Heart disease Maternal Grandfather    COPD Maternal Grandfather    Diabetes Paternal Grandmother     Social History   Socioeconomic History   Marital status: Married    Spouse name: Not on file   Number of children: Not on file   Years of education: Not on file   Highest education level: Not on file  Occupational History   Not on file  Tobacco Use   Smoking status: Former   Smokeless tobacco: Never  Vaping Use   Vaping Use: Unknown  Substance and Sexual Activity   Alcohol use: No    Comment: socially   Drug use: No   Sexual activity: Yes    Birth control/protection: I.U.D.  Other Topics Concern   Not on file  Social History Narrative   Not on file   Social Determinants of Health   Financial Resource Strain: Not on file  Food Insecurity: Not on file  Transportation Needs:  Not on file  Physical Activity: Not on file  Stress: Not on file  Social Connections: Not on file  Intimate Partner Violence: Not on file    ROS      Objective    There were no vitals taken for this visit.  Physical Exam  {Labs (Optional):23779}    Assessment & Plan:   Problem List Items Addressed This Visit   None   No follow-ups on file.   Lupita Leash, CMA

## 2022-01-06 ENCOUNTER — Ambulatory Visit: Payer: 59 | Admitting: Physician Assistant

## 2022-01-14 ENCOUNTER — Ambulatory Visit (INDEPENDENT_AMBULATORY_CARE_PROVIDER_SITE_OTHER): Payer: 59 | Admitting: Physician Assistant

## 2022-01-14 ENCOUNTER — Encounter: Payer: Self-pay | Admitting: Physician Assistant

## 2022-01-14 VITALS — BP 113/67 | HR 84 | Temp 97.7°F | Ht 64.0 in | Wt 257.0 lb

## 2022-01-14 DIAGNOSIS — H18609 Keratoconus, unspecified, unspecified eye: Secondary | ICD-10-CM | POA: Diagnosis not present

## 2022-01-14 DIAGNOSIS — J3089 Other allergic rhinitis: Secondary | ICD-10-CM | POA: Insufficient documentation

## 2022-01-14 DIAGNOSIS — Z7689 Persons encountering health services in other specified circumstances: Secondary | ICD-10-CM

## 2022-01-14 DIAGNOSIS — M79672 Pain in left foot: Secondary | ICD-10-CM

## 2022-01-14 DIAGNOSIS — G43009 Migraine without aura, not intractable, without status migrainosus: Secondary | ICD-10-CM | POA: Diagnosis not present

## 2022-01-14 DIAGNOSIS — M79671 Pain in right foot: Secondary | ICD-10-CM | POA: Diagnosis not present

## 2022-01-14 NOTE — Patient Instructions (Signed)

## 2022-01-14 NOTE — Assessment & Plan Note (Signed)
Continue to follow up with ophthalmology.

## 2022-01-14 NOTE — Assessment & Plan Note (Signed)
-  Recommend to continue oral antihistamine (Allegra) daily as needed. Will continue to monitor. ?

## 2022-01-14 NOTE — Progress Notes (Signed)
? ?New Patient Office Visit ? ?Subjective   ? ?Patient ID: Kristen Matthews, female    DOB: [birth year]  Age: 31 y.o. MRN: [id number] ? ?CC:  ?Chief Complaint  ?Patient presents with  ? New Patient (Initial Visit)  ? ? ?HPI ?[name] presents to establish care. Patient has a past medical history of GERD and elevated liver enzymes which pt reports were secondary to her pregnancy. Patient was experiencing frequent headaches and had a virtual visit 12/17/2021 and was started on Imitrex which she states medication had helped. Patient reports the past 2 weeks has not had a headache. Has reduced caffeine intake. Patient takes Human resources officer for season allergies. Also reports bilateral heel pain (L>R) which has not been as significant the last couple of weeks. 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 Reports wears good supporting shoes. 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  ? ? ? ? ?Outpatient Encounter Medications as of 01/14/2022  ?Medication Sig  ? fexofenadine (ALLEGRA) 60 MG tablet Take 60 mg by mouth 2 (two) times daily.  ? ibuprofen (ADVIL,MOTRIN) 600 MG tablet Take 1 tablet (600 mg total) by mouth every 6 (six) hours.  ? SUMAtriptan (IMITREX) 50 MG tablet Take 1 tablet (50 mg total) by mouth every 2 (two) hours as needed for migraine. May repeat in 2 hours if headache persists or recurs.  ? [DISCONTINUED] cetirizine (ZYRTEC) 10 MG tablet Take 10 mg by mouth daily. (Patient not taking: Reported on 01/14/2022)  ? ?No facility-administered encounter medications on file as of 01/14/2022.  ? ? ?Past Medical History:  ?Diagnosis Date  ? Elevated liver enzymes   ? GERD (gastroesophageal reflux disease)   ? ? ?Past Surgical History:  ?Procedure Laterality Date  ? INDUCED ABORTION    ? NO PAST SURGERIES    ? ? ?Family History  ?Problem Relation Age of Onset  ? Heart disease Mother   ? Depression Sister   ? Diabetes Maternal Aunt   ? Diabetes Maternal  Uncle   ? Heart disease Maternal Uncle   ? Hypertension Maternal Grandfather   ? Diabetes Maternal Grandfather   ? Heart disease Maternal Grandfather   ? COPD Maternal Grandfather   ? Diabetes Paternal Grandmother   ? ? ?Social History  ? ?Socioeconomic History  ? Marital status: Married  ?  Spouse name: Daurius Chezem  ? Number of children: 3  ? Years of education: Not on file  ? Highest education level: Not on file  ?Occupational History  ? Occupation: Financial controller  ?  Comment: [employer]  ?Tobacco Use  ? Smoking status: Former  ? Smokeless tobacco: Never  ?Vaping Use  ? Vaping Use: Never used  ?Substance and Sexual Activity  ? Alcohol use: No  ?  Comment: socially  ? Drug use: No  ? Sexual activity: Yes  ?  Birth control/protection: I.U.D.  ?Other Topics Concern  ? Not on file  ?Social History Narrative  ? Not on file  ? ?Social Determinants of Health  ? ?Financial Resource Strain: Not on file  ?Food Insecurity: Not on file  ?Transportation Needs: Not on file  ?Physical Activity: Not on file  ?Stress: Not on file  ?Social Connections: Not on file  ?Intimate Partner Violence: Not on file  ? ? ?ROS ?Review of Systems:  ?A fourteen system review of systems was performed and found to be positive as per HPI. ? ?  ? ? ?  Objective   ? ?BP 113/67   Pulse 84   Temp 97.7 ?F (36.5 ?C)   Ht 5\' 4"  (1.626 m)   Wt 257 lb (116.6 kg)   LMP  (LMP Unknown)   SpO2 99%   BMI 44.11 kg/m?  ? ?Physical Exam ?General:  Pleasant and cooperative, appropriate for stated age.  ?Neuro:  Alert and oriented,  extra-ocular muscles intact  ?HEENT:  Normocephalic, atraumatic, neck supple  ?Skin:  no gross rash, warm, pink. ?Cardiac:  RRR, S1 S2 ?Respiratory: CTA B/L  ?Vascular:  Ext warm, no cyanosis apprec.; cap RF less 2 sec. ?Psych:  No HI/SI, judgement and insight good, Euthymic mood. Full Affect. ? ? ?  ? ?Assessment & Plan:  ? ?Problem List Items Addressed This Visit   ? ?  ? Cardiovascular and Mediastinum  ? Migraine  without aura and without status migrainosus, not intractable - Primary  ?  -Reviewed video visit 12/17/21. Headache have improved and lessened in frequency. Recommend to continue sumatriptan  50 mg as needed for acute headache. Recommend to keep a headache journal to help identify headache triggers. Will continue to monitor.  ? ?  ?  ?  ? Other  ? Environmental and seasonal allergies  ?  -Recommend to continue oral antihistamine (Allegra) daily as needed. Will continue to monitor. ? ?  ?  ? Keratoconus  ?  -Continue to follow-up with ophthalmology.  ? ?  ?  ? ?Other Visit Diagnoses   ? ? Encounter to establish care      ? Heel pain, bilateral      ? ?  ? ?Encounter to establish care: ?-Recommend to follow-up with OB/GYN for IUD surveillance/removal.  ?-Schedule visit for CPE and FBW.  ? ?Bilateral heel pain: ?-Symptoms have improved. Discussed with patient potential etiologies such as plantar fasciitis and heel spur. Recommend to use good support shoes. If symptoms worsen or become more frequent then recommend further evaluation with imaging studies (foot x-ray) and/or referral to podiatry.  ? ? ?Return in about 6 weeks (around 02/25/2022) for CPE with FBW .  ? ?[name], PA-C ? ? ?

## 2022-01-14 NOTE — Assessment & Plan Note (Signed)
-  Reviewed video visit 12/17/21. Headache have improved and lessened in frequency. Recommend to continue sumatriptan  50 mg as needed for acute headache. Recommend to keep a headache journal to help identify headache triggers. Will continue to monitor.  ?

## 2022-02-05 ENCOUNTER — Ambulatory Visit (HOSPITAL_COMMUNITY): Payer: 59

## 2022-02-21 ENCOUNTER — Other Ambulatory Visit: Payer: Self-pay | Admitting: Physician Assistant

## 2022-02-21 DIAGNOSIS — Z0000 Encounter for general adult medical examination without abnormal findings: Secondary | ICD-10-CM

## 2022-02-21 DIAGNOSIS — R748 Abnormal levels of other serum enzymes: Secondary | ICD-10-CM

## 2022-02-24 ENCOUNTER — Other Ambulatory Visit: Payer: 59

## 2022-03-04 ENCOUNTER — Encounter: Payer: Medicaid Other | Admitting: Physician Assistant

## 2022-05-27 ENCOUNTER — Other Ambulatory Visit: Payer: Self-pay

## 2022-05-27 DIAGNOSIS — Z0000 Encounter for general adult medical examination without abnormal findings: Secondary | ICD-10-CM

## 2022-05-27 DIAGNOSIS — Z130 Encounter for screening for diseases of the blood and blood-forming organs and certain disorders involving the immune mechanism: Secondary | ICD-10-CM

## 2022-05-28 ENCOUNTER — Other Ambulatory Visit: Payer: Self-pay | Admitting: Physician Assistant

## 2022-05-28 ENCOUNTER — Other Ambulatory Visit: Payer: 59

## 2022-05-28 DIAGNOSIS — Z130 Encounter for screening for diseases of the blood and blood-forming organs and certain disorders involving the immune mechanism: Secondary | ICD-10-CM

## 2022-05-28 DIAGNOSIS — Z0000 Encounter for general adult medical examination without abnormal findings: Secondary | ICD-10-CM

## 2022-05-28 DIAGNOSIS — Z789 Other specified health status: Secondary | ICD-10-CM

## 2022-05-29 LAB — CBC WITH DIFFERENTIAL/PLATELET
Basophils Absolute: 0 10*3/uL (ref 0.0–0.2)
Basos: 1 %
EOS (ABSOLUTE): 0.2 10*3/uL (ref 0.0–0.4)
Eos: 3 %
Hematocrit: 41.8 % (ref 34.0–46.6)
Hemoglobin: 14.4 g/dL (ref 11.1–15.9)
Immature Grans (Abs): 0 10*3/uL (ref 0.0–0.1)
Immature Granulocytes: 0 %
Lymphocytes Absolute: 2 10*3/uL (ref 0.7–3.1)
Lymphs: 36 %
MCH: 28.7 pg (ref 26.6–33.0)
MCHC: 34.4 g/dL (ref 31.5–35.7)
MCV: 83 fL (ref 79–97)
Monocytes Absolute: 0.3 10*3/uL (ref 0.1–0.9)
Monocytes: 6 %
Neutrophils Absolute: 3 10*3/uL (ref 1.4–7.0)
Neutrophils: 54 %
Platelets: 249 10*3/uL (ref 150–450)
RBC: 5.02 x10E6/uL (ref 3.77–5.28)
RDW: 14.1 % (ref 11.7–15.4)
WBC: 5.6 10*3/uL (ref 3.4–10.8)

## 2022-05-29 LAB — HEMOGLOBIN A1C
Est. average glucose Bld gHb Est-mCnc: 117 mg/dL
Hgb A1c MFr Bld: 5.7 % — ABNORMAL HIGH (ref 4.8–5.6)

## 2022-05-29 LAB — COMPREHENSIVE METABOLIC PANEL
ALT: 13 IU/L (ref 0–32)
AST: 16 IU/L (ref 0–40)
Albumin/Globulin Ratio: 1.3 (ref 1.2–2.2)
Albumin: 4.3 g/dL (ref 3.9–4.9)
Alkaline Phosphatase: 62 IU/L (ref 44–121)
BUN/Creatinine Ratio: 12 (ref 9–23)
BUN: 11 mg/dL (ref 6–20)
Bilirubin Total: 0.8 mg/dL (ref 0.0–1.2)
CO2: 20 mmol/L (ref 20–29)
Calcium: 9.7 mg/dL (ref 8.7–10.2)
Chloride: 103 mmol/L (ref 96–106)
Creatinine, Ser: 0.93 mg/dL (ref 0.57–1.00)
Globulin, Total: 3.2 g/dL (ref 1.5–4.5)
Glucose: 91 mg/dL (ref 70–99)
Potassium: 4.3 mmol/L (ref 3.5–5.2)
Sodium: 137 mmol/L (ref 134–144)
Total Protein: 7.5 g/dL (ref 6.0–8.5)
eGFR: 83 mL/min/{1.73_m2} (ref >59)

## 2022-05-29 LAB — LIPID PANEL
Chol/HDL Ratio: 3.6 ratio (ref 0.0–4.4)
Cholesterol, Total: 192 mg/dL (ref 100–199)
HDL: 53 mg/dL (ref >39)
LDL Chol Calc (NIH): 124 mg/dL — ABNORMAL HIGH (ref 0–99)
Triglycerides: 82 mg/dL (ref 0–149)
VLDL Cholesterol Cal: 15 mg/dL (ref 5–40)

## 2022-05-29 LAB — MEASLES/MUMPS/RUBELLA IMMUNITY
MUMPS ABS, IGG: 190 AU/mL (ref >10.9)
RUBEOLA AB, IGG: 110 AU/mL (ref >16.4)
Rubella Antibodies, IGG: 3.24 index (ref >0.99)

## 2022-05-29 LAB — TSH: TSH: 2.03 u[IU]/mL (ref 0.450–4.500)

## 2022-06-02 ENCOUNTER — Telehealth: Payer: Self-pay

## 2022-06-02 NOTE — Telephone Encounter (Signed)
-----  Message from [name], [place] sent at 05/29/2022  5:15 PM EDT ----- Please call Kristen Matthews and notify most of her labs are normal. A1c is elevated at 5.7 in the prediabetes range so recommend to monitor/reduce simple carbohydrates and glucose intake. Bad cholesterol is elevated at 124, recommend to follow a low fat diet. MMR titer shows immunity. Labs were collected as part of her physical which looks like it was cancelled, so recommend to reschedule.   Thank you, Herb Grays

## 2022-06-02 NOTE — Telephone Encounter (Signed)
Called patient to relay message below. Patient had no further questions and verbalized understanding. Will call back to reschedule appointment.
